# Patient Record
Sex: Female | Born: 1985 | Race: Black or African American | Hispanic: No | Smoking: Never smoker
Health system: Southern US, Community
[De-identification: ages and names within clinical notes are randomized; demographics above are authoritative.]

## PROBLEM LIST (undated history)

## (undated) DIAGNOSIS — D649 Anemia, unspecified: Secondary | ICD-10-CM

---

## 2012-03-15 ENCOUNTER — Encounter (HOSPITAL_COMMUNITY): Payer: Self-pay

## 2012-03-15 ENCOUNTER — Emergency Department (HOSPITAL_COMMUNITY)
Admission: EM | Admit: 2012-03-15 | Discharge: 2012-03-15 | Disposition: A | Discharge status: Home / Self Care | Payer: Self-pay | Attending: Emergency Medicine | Admitting: Emergency Medicine

## 2012-03-15 ENCOUNTER — Emergency Department (HOSPITAL_COMMUNITY): Payer: Self-pay

## 2012-03-15 DIAGNOSIS — Z79899 Other long term (current) drug therapy: Secondary | ICD-10-CM | POA: Insufficient documentation

## 2012-03-15 DIAGNOSIS — J189 Pneumonia, unspecified organism: Secondary | ICD-10-CM | POA: Insufficient documentation

## 2012-03-15 DIAGNOSIS — R509 Fever, unspecified: Secondary | ICD-10-CM | POA: Insufficient documentation

## 2012-03-15 DIAGNOSIS — R0789 Other chest pain: Secondary | ICD-10-CM | POA: Insufficient documentation

## 2012-03-15 MED ORDER — AZITHROMYCIN 250 MG PO TABS
500.0000 mg | ORAL_TABLET | Freq: Once | ORAL | Status: AC
  Administered 2012-03-15: 500 mg via ORAL
  Filled 2012-03-15: qty 2

## 2012-03-15 MED ORDER — HYDROCODONE-ACETAMINOPHEN 5-500 MG PO TABS
1.0000 | ORAL_TABLET | Freq: Four times a day (QID) | ORAL | Status: DC | PRN
Start: 2012-03-15 — End: 2012-08-11

## 2012-03-15 MED ORDER — AZITHROMYCIN 250 MG PO TABS: 250.0000 mg | ORAL_TABLET | Freq: Every day | ORAL | Status: DC

## 2012-03-15 NOTE — ED Notes (Signed)
Chest congestion & pain and cough, yellow sputum since yesterday, chills and fever, denies any n/v/d, Body aches

## 2012-03-15 NOTE — ED Provider Notes (Addendum)
History  This chart was scribed for Gwyneth Sprout, MD by Erskine Emery, ED Scribe. This patient was seen in room TR11C/TR11C and the patient's care was started at 16:59.    CSN: [id number]  Arrival date & time 03/15/12  1631   First MD Initiated Contact with Patient 03/15/12 1659      Chief Complaint  Patient presents with  . Cough    (Consider location/radiation/quality/duration/timing/severity/associated sxs/prior treatment) The history is provided by the patient. No language interpreter was used.  Colleen Fitzgerald is a 26 y.o. female who presents to the Emergency Department complaining of chest congestion and pain, a productive cough with yellow phlegm, nasal congestion, chills, and body aches since yesterday. Pt denies any associated SOB, nausea, emesis, or diarrhea. Pt reports taking alka seltzer, which only mildly relieves her symptoms. Pt knows of no sick contacts. Pt reports she had strep throat 3 weeks ago, then her kids got it. Pt denies any h/o asthma.   History reviewed. No pertinent past medical history.  History reviewed. No pertinent past surgical history.  No family history on file.  History  Substance Use Topics  . Smoking status: Never Smoker   . Smokeless tobacco: Not on file  . Alcohol Use: No    OB History    Grav Para Term Preterm Abortions TAB SAB Ect Mult Living                  Review of Systems  Constitutional: Positive for fever and chills.  HENT: Positive for congestion.   Respiratory: Positive for cough (productive) and chest tightness. Negative for shortness of breath.   Cardiovascular: Positive for chest pain.  Gastrointestinal: Negative for nausea, vomiting and diarrhea.  Genitourinary: Negative for hematuria.  Neurological: Negative for numbness.  Psychiatric/Behavioral: The patient is not nervous/anxious.     Allergies  Review of patient's allergies indicates no known allergies.  Home Medications   Current Outpatient Rx  Name   Route  Sig  Dispense  Refill  . ASPIRIN EFFERVESCENT 325 MG PO TBEF   Oral   Take 325 mg by mouth every 6 (six) hours as needed. For cold symptoms         . TYLENOL COLD MULTI-SYMPTOM DAY PO   Oral   Take 1 capsule by mouth every 6 (six) hours as needed. For cold/flu symptoms         . NORETHIN ACE-ETH ESTRAD-FE 1-20 MG-MCG PO TABS   Oral   Take 1 tablet by mouth daily.           Triage vitals: BP 115/70  Pulse 100  Temp 98.6 F (37 C) (Oral)  Resp 20  Ht 5\' 2"  (1.575 m)  Wt 146 lb (66.225 kg)  BMI 26.70 kg/m2  SpO2 99%  LMP 03/12/2012  Physical Exam  Nursing note and vitals reviewed. Constitutional: She is oriented to person, place, and time. She appears well-developed and well-nourished. No distress.  HENT:  Head: Normocephalic and atraumatic.  Right Ear: External ear normal.  Left Ear: External ear normal.  Mouth/Throat: Oropharynx is clear and moist. No oropharyngeal exudate.       Nasal turbinates are swollen bilaterally.  Eyes: EOM are normal. Pupils are equal, round, and reactive to light.  Neck: Normal range of motion. Neck supple. No tracheal deviation present.  Cardiovascular: Normal rate, regular rhythm and normal heart sounds.   Pulmonary/Chest: Effort normal and breath sounds normal. No respiratory distress. She has no wheezes. She has no rales.  Diffuse chest wall tenderness. No wheezing or rhonchi.  Abdominal: Soft. She exhibits no distension.  Musculoskeletal: Normal range of motion. She exhibits no edema.  Neurological: She is alert and oriented to person, place, and time.  Skin: Skin is warm and dry.  Psychiatric: She has a normal mood and affect.    ED Course  Procedures (including critical care time) DIAGNOSTIC STUDIES: Oxygen Saturation is 99% on room air, nomal by my interpretation.    COORDINATION OF CARE: 17:07--I evaluated the patient and we discussed a treatment plan including a chest x-ray and cough medication to which the pt  agreed. I told the pt that she needs to stay home from work for the next few days and she requested a work note.   Labs Reviewed - No data to display Dg Chest 2 View  03/15/2012  *RADIOLOGY REPORT*  Clinical Data: Cough and SOB  CHEST - 2 VIEW  Comparison: None  Findings: Heart size is normal.  No pleural effusion or edema. Airspace consolidation within the right upper lobe is identified compatible with pneumonia.  Left lung is clear.  Visualized osseous structures are unremarkable.  IMPRESSION:  1.  Right upper lobe pneumonia.   Original Report Authenticated By: Signa Kell, M.D.      No diagnosis found.    MDM   Pt with symptoms consistent with viral URI.  Well appearing here.  No signs of breathing difficulty  No signs of pharyngitis, otitis or abnormal abdominal findings.    CXR with evidence of RUL PNA.  NOrmal O2 sats.  D/ced with abx.      I personally performed the services described in this documentation, which was scribed in my presence.  The recorded information has been reviewed and considered.    Gwyneth Sprout, MD 03/15/12 1723  Gwyneth Sprout, MD 03/15/12 [phone]

## 2012-08-11 ENCOUNTER — Encounter (HOSPITAL_COMMUNITY): Payer: Self-pay | Admitting: Emergency Medicine

## 2012-08-11 ENCOUNTER — Emergency Department (HOSPITAL_COMMUNITY)
Admission: EM | Admit: 2012-08-11 | Discharge: 2012-08-11 | Disposition: A | Discharge status: Home / Self Care | Payer: Self-pay | Attending: Emergency Medicine | Admitting: Emergency Medicine

## 2012-08-11 DIAGNOSIS — R21 Rash and other nonspecific skin eruption: Secondary | ICD-10-CM | POA: Insufficient documentation

## 2012-08-11 DIAGNOSIS — IMO0002 Reserved for concepts with insufficient information to code with codable children: Secondary | ICD-10-CM | POA: Insufficient documentation

## 2012-08-11 DIAGNOSIS — Z79899 Other long term (current) drug therapy: Secondary | ICD-10-CM | POA: Insufficient documentation

## 2012-08-11 DIAGNOSIS — L0390 Cellulitis, unspecified: Secondary | ICD-10-CM

## 2012-08-11 MED ORDER — DOXYCYCLINE HYCLATE 100 MG PO CAPS: 100.0000 mg | ORAL_CAPSULE | Freq: Two times a day (BID) | ORAL | Status: DC

## 2012-08-11 NOTE — ED Provider Notes (Signed)
Medical screening examination/treatment/procedure(s) were performed by non-physician practitioner and as supervising physician I was immediately available for consultation/collaboration.  Lianna Sitzmann R. Avamarie Crossley, MD 08/11/12 1545 

## 2012-08-11 NOTE — ED Provider Notes (Signed)
History     CSN: [id number]  Arrival date & time 08/11/12  1125   First MD Initiated Contact with Patient 08/11/12 1317      Chief Complaint  Patient presents with  . Rash    (Consider location/radiation/quality/duration/timing/severity/associated sxs/prior treatment) HPI Patient presents to the emergency department with rash and redness to her upper front right forearm and bruising, and rash to her right lower leg.  Patient, states she felt like she got bitten by a bug on Saturday night.  She woke up with increased, redness and swelling to her right forearm and bruising to her right lower leg.  Patient was seen in another emergency department and given 5 days for the Keflex twice a day and prednisone along with hydroxyzine.  Patient, states, that the arm seems better, but still red.  Patient denies chest pain, shortness of breath, bleeding, vomiting, nausea, abdominal pain, or fever. History reviewed. No pertinent past medical history.  History reviewed. No pertinent past surgical history.  History reviewed. No pertinent family history.  History  Substance Use Topics  . Smoking status: Never Smoker   . Smokeless tobacco: Not on file  . Alcohol Use: No    OB History   Grav Para Term Preterm Abortions TAB SAB Ect Mult Living                  Review of Systems All other systems negative except as documented in the HPI. All pertinent positives and negatives as reviewed in the HPI. Allergies  Review of patient's allergies indicates no known allergies.  Home Medications   Current Outpatient Rx  Name  Route  Sig  Dispense  Refill  . cephALEXin (KEFLEX) 500 MG capsule   Oral   Take 500 mg by mouth 2 (two) times daily.         . hydrOXYzine (ATARAX/VISTARIL) 25 MG tablet   Oral   Take 25 mg by mouth 4 (four) times daily.         . norethindrone-ethinyl estradiol (JUNEL FE,GILDESS FE,LOESTRIN FE) 1-20 MG-MCG tablet   Oral   Take 1 tablet by mouth at bedtime.            . predniSONE (DELTASONE) 20 MG tablet   Oral   Take 20 mg by mouth 2 (two) times daily.           BP 119/71  Temp(Src) 97.7 F (36.5 C) (Oral)  Resp 18  SpO2 100%  Physical Exam  Constitutional: She is oriented to person, place, and time. She appears well-developed and well-nourished. No distress.  Eyes: Pupils are equal, round, and reactive to light.  Cardiovascular: Normal rate and regular rhythm.   Pulmonary/Chest: Effort normal.  Musculoskeletal:       Arms:      Legs: Neurological: She is alert and oriented to person, place, and time.    ED Course  Procedures (including critical care time)  Patient be treated with antibiotics and told to continue the prednisone.  I advised her, that we'll have her followup with dermatology.  Also, told to return here for 2 days and recheck   MDM          Carlyle Dolly, PA-C 08/11/12 1423

## 2012-08-11 NOTE — ED Notes (Signed)
Pt c/o itching rash to arms seen at another ED on Sunday for same; pt sts some bruising noted to legs with unknown cause

## 2012-12-14 ENCOUNTER — Encounter (HOSPITAL_COMMUNITY): Payer: Self-pay | Admitting: Emergency Medicine

## 2012-12-14 ENCOUNTER — Emergency Department (INDEPENDENT_AMBULATORY_CARE_PROVIDER_SITE_OTHER)
Admission: EM | Admit: 2012-12-14 | Discharge: 2012-12-14 | Disposition: A | Discharge status: Home / Self Care | Payer: Self-pay | Source: Home / Self Care

## 2012-12-14 DIAGNOSIS — M545 Low back pain, unspecified: Secondary | ICD-10-CM

## 2012-12-14 MED ORDER — DICLOFENAC SODIUM 75 MG PO TBEC: 75.0000 mg | DELAYED_RELEASE_TABLET | Freq: Two times a day (BID) | ORAL | Status: DC

## 2012-12-14 MED ORDER — CYCLOBENZAPRINE HCL 10 MG PO TABS: 10.0000 mg | ORAL_TABLET | Freq: Three times a day (TID) | ORAL | Status: DC | PRN

## 2012-12-14 NOTE — ED Notes (Signed)
C/o back pain which started a week ago.  Denies injury.  Pain medications taken but no relief.

## 2012-12-14 NOTE — ED Provider Notes (Signed)
Alannis Hsia is a 27 y.o. female who presents to Urgent Care today for back pain present for one week without injury. Patient notes pain in her bilateral lower back. She denies any pain radiating weakness numbness difficulty walking fevers or chills. She's tried over-the-counter pain medications which we'll been somewhat helpful. She feels well otherwise. The pain is moderate and worse with activity.    PMH reviewed. Healthy History  Substance Use Topics  . Smoking status: Never Smoker   . Smokeless tobacco: Not on file  . Alcohol Use: No   ROS as above Medications reviewed. No current facility-administered medications for this encounter.   Current Outpatient Prescriptions  Medication Sig Dispense Refill  . cyclobenzaprine (FLEXERIL) 10 MG tablet Take 1 tablet (10 mg total) by mouth 3 (three) times daily as needed for muscle spasms.  30 tablet  0  . diclofenac (VOLTAREN) 75 MG EC tablet Take 1 tablet (75 mg total) by mouth 2 (two) times daily.  60 tablet  0  . norethindrone-ethinyl estradiol (JUNEL FE,GILDESS FE,LOESTRIN FE) 1-20 MG-MCG tablet Take 1 tablet by mouth at bedtime.         Exam:  BP 113/75  Pulse 75  Temp(Src) 98.1 F (36.7 C) (Oral)  Resp 15  SpO2 100%  LMP 11/13/2012 Gen: Well NAD Back: Nontender spinal midline. Tender to palpation bilateral paraspinals. Decreased flexion normal extension. Negative straight leg raise test. Strength is intact bilateral lower extremity. Patient can stand on heels and toes and squat and get on and off exam table by herself. Normal gait Tablet refill sensation intact distally bilaterally No results found for this or any previous visit (from the past 24 hour(s)). No results found.  Assessment and Plan: 27 y.o. female with lumbar myofascial pain. Plan to treat with diclofenac and Flexeril. Heating pad and home exercise program. F/u PRN Discussed warning signs or symptoms. Please see discharge instructions. Patient expresses  understanding.      Rodolph Bong, MD 12/14/12 [phone]

## 2012-12-19 ENCOUNTER — Encounter (HOSPITAL_COMMUNITY): Payer: Self-pay | Admitting: Emergency Medicine

## 2012-12-19 ENCOUNTER — Emergency Department (INDEPENDENT_AMBULATORY_CARE_PROVIDER_SITE_OTHER)
Admission: EM | Admit: 2012-12-19 | Discharge: 2012-12-19 | Disposition: A | Discharge status: Home / Self Care | Payer: No Typology Code available for payment source | Source: Home / Self Care

## 2012-12-19 DIAGNOSIS — S39012D Strain of muscle, fascia and tendon of lower back, subsequent encounter: Secondary | ICD-10-CM

## 2012-12-19 DIAGNOSIS — J029 Acute pharyngitis, unspecified: Secondary | ICD-10-CM

## 2012-12-19 DIAGNOSIS — J309 Allergic rhinitis, unspecified: Secondary | ICD-10-CM

## 2012-12-19 DIAGNOSIS — R0982 Postnasal drip: Secondary | ICD-10-CM

## 2012-12-19 MED ORDER — TRAMADOL HCL 50 MG PO TABS: 50.0000 mg | ORAL_TABLET | Freq: Four times a day (QID) | ORAL | Status: DC | PRN

## 2012-12-19 NOTE — ED Provider Notes (Signed)
Medical screening examination/treatment/procedure(s) were performed by resident physician or non-physician practitioner and as supervising physician I was immediately available for consultation/collaboration.   Latrease Kunde DOUGLAS MD.   Quinten Allerton D Shraddha Lebron, MD 12/19/12 1251 

## 2012-12-19 NOTE — ED Provider Notes (Signed)
CSN: [id number]     Arrival date & time 12/19/12  1027 History     First MD Initiated Contact with Patient 12/19/12 1107     Chief Complaint  Patient presents with  . Back Pain  . Sore Throat   (Consider location/radiation/quality/duration/timing/severity/associated sxs/prior Treatment) HPI Comments: 27 year old female presents with sore throat for 2 days and back pain for 2 weeks. She states the sore throat has gradually been getting worse. It is worse with night and associated with coughing. During the day she is not proceed PND that is clearing her throat often. The patient was seen 5 days ago here in the urgent care for her back pain. She was diagnosed with paralumbar muscle pain and prescribed diclofenac and Flexeril. She states that these medicines have not been helping. She states that she has decreased some of her lifting and pulling B. 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 No knee symptoms, no radicular pain or focal weakness or paresthesias.   History reviewed. No pertinent past medical history. History reviewed. No pertinent past surgical history. No family history on file. History  Substance Use Topics  . Smoking status: Never Smoker   . Smokeless tobacco: Not on file  . Alcohol Use: No   OB History   Grav Para Term Preterm Abortions TAB SAB Ect Mult Living                 Review of Systems  Constitutional: Negative for fever and appetite change.  HENT: Positive for congestion, sore throat and rhinorrhea. Negative for hearing loss, trouble swallowing, neck pain, postnasal drip and sinus pressure.   Respiratory: Negative.   Cardiovascular: Negative for chest pain and leg swelling.  Gastrointestinal: Negative.   Genitourinary: Negative.   Musculoskeletal: Positive for back pain.  Skin: Negative for rash.  Neurological: Negative for dizziness, weakness, numbness and headaches.    Allergies  Review of patient's allergies indicates no known allergies.  Home  Medications   Current Outpatient Rx  Name  Route  Sig  Dispense  Refill  . cyclobenzaprine (FLEXERIL) 10 MG tablet   Oral   Take 1 tablet (10 mg total) by mouth 3 (three) times daily as needed for muscle spasms.   30 tablet   0   . diclofenac (VOLTAREN) 75 MG EC tablet   Oral   Take 1 tablet (75 mg total) by mouth 2 (two) times daily.   60 tablet   0   . norethindrone-ethinyl estradiol (JUNEL FE,GILDESS FE,LOESTRIN FE) 1-20 MG-MCG tablet   Oral   Take 1 tablet by mouth at bedtime.          . traMADol (ULTRAM) 50 MG tablet   Oral   Take 1 tablet (50 mg total) by mouth every 6 (six) hours as needed for pain.   15 tablet   0    BP 119/77  Pulse 89  Temp(Src) 98.4 F (36.9 C) (Oral)  Resp 18  SpO2 100%  LMP 11/13/2012 Physical Exam  Nursing note and vitals reviewed. Constitutional: She is oriented to person, place, and time. She appears well-developed and well-nourished. No distress.  Eyes: Conjunctivae and EOM are normal.  Neck: Normal range of motion. Neck supple.  Cardiovascular: Normal rate, regular rhythm and normal heart sounds.   Pulmonary/Chest: Effort normal and breath sounds normal. No respiratory distress. She has no wheezes. She has no rales.  Abdominal: Soft. There is no tenderness.  Musculoskeletal: She exhibits tenderness. She exhibits no edema.  Limited flexion of the lumbar spine to 20. Tenderness over the paralumbar musculature with a (the right. No spinal tenderness or deformity.  Neurological: She is alert and oriented to person, place, and time. No cranial nerve deficit. She exhibits normal muscle tone.  To essentially normal. Use of the legs is unchanged, strong muscle tone. She is able to get onto and off the table without assistance.  Skin: Skin is warm and dry. No rash noted. She is not diaphoretic.  Psychiatric: She has a normal mood and affect.    ED Course   Procedures (including critical care time)  Labs Reviewed  POCT RAPID STREP A  ([facility] ONLY)   No results found. 1. Lumbar strain, subsequent encounter   2. Pharyngitis   3. PND (post-nasal drip)   4. Allergic rhinitis     MDM  No more lifting, bending, pulling. Out of work next 2 days skin restrictions for 7 days. Continue current medications and had Ultram 50 mg by mouth every 6 hours when necessary pain Apply heat over the muscles of pain Perform frequent stretches is demonstrated and instructions As her back is feeling better start the rehabilitation back exercises on the instruction sheet. Patient is reminded to 95% of low musculoskeletal back pain will improve in less than 2 weeks if you apply the correct treatment and avoid exacerbating activities.  Hayden Rasmussen, NP 12/19/12 1200

## 2012-12-19 NOTE — ED Notes (Signed)
Pt c/o persistent back pain onset 2 weeks... Reports she was seen on 8/11 and saw Dr.Corey Given Diclofenac and Flexeril w/no relief... Denies inj/trauma, strenuous activity, urinary sxs, abd pain Also c/o sore throat onset 2 days. sxs include: odynophagia, f/v/n/d Alert w/no signs of acute distress.

## 2012-12-21 LAB — CULTURE, GROUP A STREP

## 2013-01-01 ENCOUNTER — Ambulatory Visit: Payer: Self-pay | Attending: Family Medicine

## 2013-01-03 ENCOUNTER — Encounter (HOSPITAL_COMMUNITY): Payer: Self-pay | Admitting: Physical Medicine and Rehabilitation

## 2013-01-03 ENCOUNTER — Emergency Department (HOSPITAL_COMMUNITY)
Admission: EM | Admit: 2013-01-03 | Discharge: 2013-01-03 | Disposition: A | Discharge status: Home / Self Care | Payer: No Typology Code available for payment source | Attending: Emergency Medicine | Admitting: Emergency Medicine

## 2013-01-03 ENCOUNTER — Emergency Department (HOSPITAL_COMMUNITY): Payer: Self-pay

## 2013-01-03 DIAGNOSIS — Z79899 Other long term (current) drug therapy: Secondary | ICD-10-CM | POA: Insufficient documentation

## 2013-01-03 DIAGNOSIS — Z3202 Encounter for pregnancy test, result negative: Secondary | ICD-10-CM | POA: Insufficient documentation

## 2013-01-03 DIAGNOSIS — M549 Dorsalgia, unspecified: Secondary | ICD-10-CM

## 2013-01-03 DIAGNOSIS — N390 Urinary tract infection, site not specified: Secondary | ICD-10-CM | POA: Insufficient documentation

## 2013-01-03 LAB — URINE MICROSCOPIC-ADD ON

## 2013-01-03 LAB — URINALYSIS, ROUTINE W REFLEX MICROSCOPIC
Nitrite: NEGATIVE
Specific Gravity, Urine: 1.008 (ref 1.005–1.030)
Urobilinogen, UA: 0.2 mg/dL (ref 0.0–1.0)

## 2013-01-03 LAB — POCT PREGNANCY, URINE: Preg Test, Ur: NEGATIVE

## 2013-01-03 MED ORDER — CEPHALEXIN 500 MG PO CAPS: 500.0000 mg | ORAL_CAPSULE | Freq: Three times a day (TID) | ORAL | Status: DC

## 2013-01-03 MED ORDER — DEXAMETHASONE SODIUM PHOSPHATE 10 MG/ML IJ SOLN
10.0000 mg | Freq: Once | INTRAMUSCULAR | Status: AC
  Administered 2013-01-03: 10 mg via INTRAMUSCULAR
  Filled 2013-01-03: qty 1

## 2013-01-03 MED ORDER — HYDROCODONE-ACETAMINOPHEN 5-325 MG PO TABS: 1.0000 | ORAL_TABLET | Freq: Four times a day (QID) | ORAL | Status: DC | PRN

## 2013-01-03 MED ORDER — MELOXICAM 15 MG PO TABS: 15.0000 mg | ORAL_TABLET | Freq: Every day | ORAL | Status: DC

## 2013-01-03 MED ORDER — IBUPROFEN 800 MG PO TABS
800.0000 mg | ORAL_TABLET | Freq: Once | ORAL | Status: AC
  Administered 2013-01-03: 800 mg via ORAL
  Filled 2013-01-03: qty 1

## 2013-01-03 NOTE — ED Notes (Signed)
Pt attempted to give [place] a urine sample but was unable right now

## 2013-01-03 NOTE — ED Provider Notes (Signed)
CSN: [id number]     Arrival date & time 01/03/13  4098 History   First MD Initiated Contact with Patient 01/03/13 0932     Chief Complaint  Patient presents with  . Abdominal Pain   (Consider location/radiation/quality/duration/timing/severity/associated sxs/prior Treatment) HPI  27 year old female presents emergency by with chief complaint of back pain.  The patient has been seen twice previously at [facility] urgent care facility.  She states that the pain has been going on for 3 weeks.  It is worsened by standing, walking and moving.  Lying down relieved her pain.  The patient has taken Flexeril, tramadol, and full apparent without relief of her symptoms.  The patient denies any radiation down the legs.  Patient states that the pain is constant however at times it has felt as if it is getting better.  Yesterday she did house work around her house and today she is in severe pain.  She states her husband had to help her out of bed and get her into warm bath before she was able to break the spasm in her back and get up to walk. Denies weakness, loss of bowel/bladder function or saddle anesthesia. Denies neck stiffness, headache, rash.  Denies fever or recent procedures to back. The patient denies urinary or vaginal symptoms.  She denies flank pain, colicky pain no history of kidney stones.    No past medical history on file. No past surgical history on file. No family history on file. History  Substance Use Topics  . Smoking status: Never Smoker   . Smokeless tobacco: Not on file  . Alcohol Use: No   OB History   Grav Para Term Preterm Abortions TAB SAB Ect Mult Living                 Review of Systems  Constitutional: Negative for fever, chills and fatigue.  HENT: Negative for neck pain and neck stiffness.   Eyes: Negative for visual disturbance.  Respiratory: Negative for chest tightness and shortness of breath.   Cardiovascular: Negative for chest pain.  Gastrointestinal:  Negative for nausea, vomiting, abdominal pain and constipation.  Genitourinary: Negative for dysuria, frequency, hematuria, flank pain, vaginal discharge and pelvic pain.  Musculoskeletal: Positive for back pain and gait problem. Negative for myalgias, joint swelling and arthralgias.  Neurological: Negative for weakness, numbness and headaches.  Psychiatric/Behavioral: Negative for behavioral problems and confusion. The patient is not nervous/anxious.     Allergies  Review of patient's allergies indicates no known allergies.  Home Medications   Current Outpatient Rx  Name  Route  Sig  Dispense  Refill  . cyclobenzaprine (FLEXERIL) 10 MG tablet   Oral   Take 1 tablet (10 mg total) by mouth 3 (three) times daily as needed for muscle spasms.   30 tablet   0   . diclofenac (VOLTAREN) 75 MG EC tablet   Oral   Take 1 tablet (75 mg total) by mouth 2 (two) times daily.   60 tablet   0   . norethindrone-ethinyl estradiol (JUNEL FE,GILDESS FE,LOESTRIN FE) 1-20 MG-MCG tablet   Oral   Take 1 tablet by mouth at bedtime.          . traMADol (ULTRAM) 50 MG tablet   Oral   Take 1 tablet (50 mg total) by mouth every 6 (six) hours as needed for pain.   15 tablet   0    BP 123/66  Pulse 96  Temp(Src) 98.2 F (36.8 C) (Oral)  Resp 18  SpO2 99%  LMP 11/13/2012 Physical Exam  Constitutional: She is oriented to person, place, and time. She appears well-developed and well-nourished. No distress.  HENT:  Head: Normocephalic and atraumatic.  Eyes: Conjunctivae are normal. No scleral icterus.  Neck: Normal range of motion.  Cardiovascular: Normal rate, regular rhythm and normal heart sounds.  Exam reveals no gallop and no friction rub.   No murmur heard. Pulmonary/Chest: Effort normal and breath sounds normal. No respiratory distress.  Abdominal: Soft. Bowel sounds are normal. She exhibits no distension and no mass. There is no tenderness. There is no guarding.  Musculoskeletal:   Patient is exquisitely tender to palpation over the right SI joint.  She has moderate tenderness to palpation over the left SI joint.  Chest palpable spasm in the bilateral lumbar paraspinal muscles.  No CVA tenderness.  Range of motion is limited due to pain.  DTR is normal.  Gait is antalgic.  She is neurovascularly intact.  Neurological: She is alert and oriented to person, place, and time.  Skin: Skin is warm and dry. She is not diaphoretic.    ED Course  Procedures (including critical care time) Labs Review Labs Reviewed  URINALYSIS, ROUTINE W REFLEX MICROSCOPIC - Abnormal; Notable for the following:    APPearance CLOUDY (*)    Hgb urine dipstick TRACE (*)    Leukocytes, UA LARGE (*)    All other components within normal limits  URINE MICROSCOPIC-ADD ON - Abnormal; Notable for the following:    Squamous Epithelial / LPF FEW (*)    Bacteria, UA FEW (*)    All other components within normal limits  POCT PREGNANCY, URINE   Imaging Review Dg Lumbar Spine Complete  01/03/2013   *RADIOLOGY REPORT*  Clinical Data: Back pain  LUMBAR SPINE - COMPLETE 4+ VIEW  Comparison: None.  Findings: Normal alignment without fracture.  Preserved vertebral body heights and disc spaces.  No definite pars defects.  Normal pedicles. Slight sclerosis of the right SI joint laterally. Nonobstructive bowel gas pattern.  No acute osseous finding.  IMPRESSION: No acute finding.   Original Report Authenticated By: Judie Petit. Miles Costain, M.D.    MDM   1. Back pain   2. UTI (lower urinary tract infection)    Patient with low back pain ongoing for 3 weeks.  Her symptoms appear consistent with sacroiliitis.  There applied symptoms.  In getting imaging of the lumbar spine.  Urinalysis to rule out infection.  Patient given 800 mg ibuprofen by mouth for pain while here.  12:12 PM BP 123/66  Pulse 96  Temp(Src) 98.2 F (36.8 C) (Oral)  Resp 18  SpO2 99%  LMP 12/23/2012 Patient results show sclerosis of the right SI joint.   This is consistent with her physical exam.  Personally reviewed the images using our PACs system.  The patient also appears to have a urinary tract infection today.  I'm going to change the patient's medication regimen to Mobic one daily, Norco for when her pain is too severe, and Keflex for her urinary tract infection.  The patient will follow up with community health and the vomitus.  I asked her to restrict activities at work and she should not lift anything over 10 pounds.  Ice instead of heat.  Arthor Captain, PA-C 01/03/13 1214

## 2013-01-03 NOTE — ED Notes (Signed)
PT ambulated with baseline gait; VSS; A&Ox3; no signs of distress; respirations even and unlabored; skin warm and dry; no questions upon discharge.  

## 2013-01-03 NOTE — ED Notes (Signed)
Pt presents to department for evaluation of R sided flank pain radiating to lower abdomen. Ongoing x3 weeks. Was seen for same and prescribed muscle relaxer, no relief of pain with medication. 10/10 pain upon arrival. Denies urinary symptoms. No nausea/vomiting. Pt is alert and oriented x4.

## 2013-01-03 NOTE — ED Provider Notes (Signed)
Medical screening examination/treatment/procedure(s) were performed by non-physician practitioner and as supervising physician I was immediately available for consultation/collaboration.  Courtney F Horton, MD 01/03/13 1929 

## 2013-01-03 NOTE — ED Notes (Signed)
Patient transported to X-ray 

## 2013-01-04 LAB — URINE CULTURE

## 2013-01-20 ENCOUNTER — Ambulatory Visit: Payer: No Typology Code available for payment source

## 2013-02-08 ENCOUNTER — Encounter: Payer: Self-pay | Admitting: Internal Medicine

## 2013-02-08 ENCOUNTER — Ambulatory Visit: Payer: No Typology Code available for payment source | Attending: Internal Medicine | Admitting: Internal Medicine

## 2013-02-08 VITALS — BP 106/70 | HR 87 | Temp 99.2°F | Resp 18 | Wt 158.0 lb

## 2013-02-08 DIAGNOSIS — M62830 Muscle spasm of back: Secondary | ICD-10-CM | POA: Insufficient documentation

## 2013-02-08 DIAGNOSIS — M545 Low back pain, unspecified: Secondary | ICD-10-CM | POA: Insufficient documentation

## 2013-02-08 DIAGNOSIS — M5380 Other specified dorsopathies, site unspecified: Secondary | ICD-10-CM

## 2013-02-08 DIAGNOSIS — R6889 Other general symptoms and signs: Secondary | ICD-10-CM | POA: Insufficient documentation

## 2013-02-08 NOTE — Progress Notes (Signed)
Pt is here to est care C/o lower back pain Has been to [facility] ER and Urgent Care for same sxs Alert w/no signs of acute distress.

## 2013-02-08 NOTE — Progress Notes (Signed)
Patient ID: Colleen Fitzgerald, female   DOB: 08-07-1985, 27 y.o.   MRN: [id number]  Patient Demographics  Colleen Fitzgerald, is a 27 y.o. female  CSN: [id number]  MRN: [id number]  DOB - 1986/04/28  Outpatient Primary MD for the patient is No PCP Per Patient   With History of -  History reviewed. No pertinent past medical history.    History reviewed. No pertinent past surgical history.  in for   Chief Complaint  Patient presents with  . Establish Care     HPI  Colleen Fitzgerald  is a 27 y.o. female, no medical problems except lower back pain which happened a few weeks ago, she also feels cold more than other people. No other subjective complaints. Her back pain actually has now resolved, she comes to the clinic to establish care. She denies any headache. No chest pain shortness of breath, no abdominal pain or discomfort no bowel bladder incontinence. No weakness in any extremity.    Review of Systems    In addition to the HPI above,  No Fever-chills, No Headache, No changes with Vision or hearing, No problems swallowing food or Liquids, No Chest pain, Cough or Shortness of Breath, No Abdominal pain, No Nausea or Vommitting, Bowel movements are regular, No Blood in stool or Urine, No dysuria, No new skin rashes or bruises, No new joints pains-aches,  No new weakness, tingling, numbness in any extremity, No recent weight gain or loss, No polyuria, polydypsia or polyphagia, No significant Mental Stressors.  A full 10 point Review of Systems was done, except as stated above, all other Review of Systems were negative.   Social History History  Substance Use Topics  . Smoking status: Never Smoker   . Smokeless tobacco: Not on file  . Alcohol Use: No     Family History No history of CAD at young age  Prior to Admission medications   Medication Sig Start Date End Date Taking? Authorizing Provider  meloxicam (MOBIC) 15 MG tablet Take 1 tablet (15 mg total) by mouth  daily. Take 1 daily with food. 01/03/13  Yes Arthor Captain, PA-C  norethindrone-ethinyl estradiol (JUNEL FE,GILDESS FE,LOESTRIN FE) 1-20 MG-MCG tablet Take 1 tablet by mouth at bedtime.    Yes Historical Provider, MD  cephALEXin (KEFLEX) 500 MG capsule Take 1 capsule (500 mg total) by mouth 3 (three) times daily. 01/03/13   Arthor Captain, PA-C  HYDROcodone-acetaminophen (NORCO) 5-325 MG per tablet Take 1-2 tablets by mouth every 6 (six) hours as needed for pain. 01/03/13   Arthor Captain, PA-C    No Known Allergies  Physical Exam  Vitals  Blood pressure 106/70, pulse 87, temperature 99.2 F (37.3 C), temperature source Oral, resp. rate 18, weight 158 lb (71.668 kg), SpO2 100.00%.   1. General Young African American female sitting on clinic examination table in no apparent distress,     2. Normal affect and insight, Not Suicidal or Homicidal, Awake Alert, Oriented X 3.  3. No F.N deficits, ALL C.Nerves Intact, Strength 5/5 all 4 extremities, Sensation intact all 4 extremities, Plantars down going.  4. Ears and Eyes appear Normal, Conjunctivae clear, PERRLA. Moist Oral Mucosa.  5. Supple Neck, No JVD, No cervical lymphadenopathy appriciated, No Carotid Bruits.  6. Symmetrical Chest wall movement, Good air movement bilaterally, CTAB.  7. RRR, No Gallops, Rubs or Murmurs, No Parasternal Heave.  8. Positive Bowel Sounds, Abdomen Soft, Non tender, No organomegaly appriciated,No rebound -guarding or rigidity.  9.  No Cyanosis, Normal Skin Turgor,  No Skin Rash or Bruise.  10. Good muscle tone,  joints appear normal , no effusions, Normal ROM. No spine tenderness on exam, good range of motion, straight leg raise test negative bilaterally.  11. No Palpable Lymph Nodes in Neck or Axillae     Data Review  No results found for this basename: WBC, HGB, HCT, MCV, PLT      Chemistry   No results found for this basename: NA, K, CL, CO2, BUN, CREATININE, GLU   No results found for this  basename: CALCIUM, ALKPHOS, AST, ALT, BILITOT       No results found for this basename: HGBA1C    No results found for this basename: CHOL, HDL, LDLCALC, LDLDIRECT, TRIG, CHOLHDL    No results found for this basename: TSH    No results found for this basename: PSA         Assessment and plan  Lower back pain. 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 Pain has now resolved. L-spine x-rays done in the ER reviewed and they were unremarkable. She has no pain or tenderness on exam, good range of motion, straight leg raise test is negative bilaterally. She was advised to wear lumbar support belt at work. Over-the-counter ibuprofen and heating pads if pain reoccurs.   Feeling cold all the time. We'll check TSH   Routine health maintenance.  Patient is up-to-date on tetanus shot. She refuses flu shot.  Last Pap smear was less than a month ago and it was unremarkable per patient    Leroy Sea M.D on 02/08/2013 at 4:30 PM

## 2013-02-09 LAB — TSH: TSH: 2.355 u[IU]/mL (ref 0.350–4.500)

## 2013-02-15 ENCOUNTER — Telehealth: Payer: Self-pay

## 2013-02-15 NOTE — Telephone Encounter (Signed)
Patient left message on the phone to call her Back-called back and got her machine Left message to return our call

## 2013-05-04 ENCOUNTER — Encounter: Payer: Self-pay | Admitting: Internal Medicine

## 2013-05-04 ENCOUNTER — Ambulatory Visit: Payer: No Typology Code available for payment source | Attending: Internal Medicine | Admitting: Internal Medicine

## 2013-05-04 VITALS — BP 114/80 | HR 85 | Temp 98.2°F | Resp 16 | Ht 62.0 in | Wt 160.0 lb

## 2013-05-04 DIAGNOSIS — R5381 Other malaise: Secondary | ICD-10-CM | POA: Insufficient documentation

## 2013-05-04 LAB — COMPLETE METABOLIC PANEL WITH GFR
AST: 13 U/L (ref 0–37)
Albumin: 3.8 g/dL (ref 3.5–5.2)
Alkaline Phosphatase: 60 U/L (ref 39–117)
Calcium: 8.9 mg/dL (ref 8.4–10.5)
Chloride: 105 mEq/L (ref 96–112)
Potassium: 3.7 mEq/L (ref 3.5–5.3)
Sodium: 137 mEq/L (ref 135–145)
Total Protein: 7 g/dL (ref 6.0–8.3)

## 2013-05-04 LAB — CBC WITH DIFFERENTIAL/PLATELET
Basophils Absolute: 0 10*3/uL (ref 0.0–0.1)
Eosinophils Relative: 2 % (ref 0–5)
HCT: 38 % (ref 36.0–46.0)
Lymphocytes Relative: 23 % (ref 12–46)
Lymphs Abs: 1.8 10*3/uL (ref 0.7–4.0)
MCV: 90.3 fL (ref 78.0–100.0)
Monocytes Absolute: 0.7 10*3/uL (ref 0.1–1.0)
Neutro Abs: 5 10*3/uL (ref 1.7–7.7)
Platelets: 267 10*3/uL (ref 150–400)
RBC: 4.21 MIL/uL (ref 3.87–5.11)
RDW: 14.2 % (ref 11.5–15.5)
WBC: 7.6 10*3/uL (ref 4.0–10.5)

## 2013-05-04 NOTE — Progress Notes (Signed)
Patient ID: Colleen Fitzgerald, female   DOB: [birth year], 27 y.o.   MRN: [id number]   CC:  HPI: 27 year old female here for evaluation of fatigue. She denies any back pain. She states that she has no sexual drive. She wanted to change her birth control She has been obtaining birth control from Dr. Mayford Knife gynecologist in [facility] The patient denies any chest pain shortness of breath any other cardiopulmonary symptoms    No Known Allergies History reviewed. No pertinent past medical history. Current Outpatient Prescriptions on File Prior to Visit  Medication Sig Dispense Refill  . norethindrone-ethinyl estradiol (JUNEL FE,GILDESS FE,LOESTRIN FE) 1-20 MG-MCG tablet Take 1 tablet by mouth at bedtime.       . cephALEXin (KEFLEX) 500 MG capsule Take 1 capsule (500 mg total) by mouth 3 (three) times daily.  30 capsule  0  . HYDROcodone-acetaminophen (NORCO) 5-325 MG per tablet Take 1-2 tablets by mouth every 6 (six) hours as needed for pain.  20 tablet  0  . meloxicam (MOBIC) 15 MG tablet Take 1 tablet (15 mg total) by mouth daily. Take 1 daily with food.  10 tablet  0   No current facility-administered medications on file prior to visit.   Family History  Problem Relation Age of Onset  . Hypertension Mother   . Diabetes Father    History   Social History  . Marital Status: Single    Spouse Name: N/A    Number of Children: N/A  . Years of Education: N/A   Occupational History  . Not on file.   Social History Main Topics  . Smoking status: Never Smoker   . Smokeless tobacco: Not on file  . Alcohol Use: No  . Drug Use: No  . Sexual Activity: Yes    Birth Control/ Protection: None, Pill   Other Topics Concern  . Not on file   Social History Narrative  . No narrative on file    Review of Systems  Constitutional: Negative for fever, chills, diaphoresis, activity change, appetite change and fatigue.  HENT: Negative for ear pain, nosebleeds, congestion, facial swelling,  rhinorrhea, neck pain, neck stiffness and ear discharge.   Eyes: Negative for pain, discharge, redness, itching and visual disturbance.  Respiratory: Negative for cough, choking, chest tightness, shortness of breath, wheezing and stridor.   Cardiovascular: Negative for chest pain, palpitations and leg swelling.  Gastrointestinal: Negative for abdominal distention.  Genitourinary: Negative for dysuria, urgency, frequency, hematuria, flank pain, decreased urine volume, difficulty urinating and dyspareunia.  Musculoskeletal: Negative for back pain, joint swelling, arthralgias and gait problem.  Neurological: Negative for dizziness, tremors, seizures, syncope, facial asymmetry, speech difficulty, weakness, light-headedness, numbness and headaches.  Hematological: Negative for adenopathy. Does not bruise/bleed easily.  Psychiatric/Behavioral: Negative for hallucinations, behavioral problems, confusion, dysphoric mood, decreased concentration and agitation.    Objective:  There were no vitals filed for this visit.  Physical Exam  Constitutional: Appears well-developed and well-nourished. No distress.  HENT: Normocephalic. External right and left ear normal. Oropharynx is clear and moist.  Eyes: Conjunctivae and EOM are normal. PERRLA, no scleral icterus.  Neck: Normal ROM. Neck supple. No JVD. No tracheal deviation. No thyromegaly.  CVS: RRR, S1/S2 +, no murmurs, no gallops, no carotid bruit.  Pulmonary: Effort and breath sounds normal, no stridor, rhonchi, wheezes, rales.  Abdominal: Soft. BS +,  no distension, tenderness, rebound or guarding.  Musculoskeletal: Normal range of motion. No edema and no tenderness.  Lymphadenopathy: No lymphadenopathy noted, cervical, inguinal. Neuro:  Alert. Normal reflexes, muscle tone coordination. No cranial nerve deficit. Skin: Skin is warm and dry. No rash noted. Not diaphoretic. No erythema. No pallor.  Psychiatric: Normal mood and affect. Behavior,  judgment, thought content normal.   No results found for this basename: WBC, HGB, HCT, MCV, PLT   No results found for this basename: CREATININE, BUN, NA, K, CL, CO2    No results found for this basename: HGBA1C   Lipid Panel  No results found for this basename: chol, trig, hdl, cholhdl, vldl, ldlcalc       Assessment and plan:   Patient Active Problem List   Diagnosis Date Noted  . Back muscle spasm 02/08/2013  . Cold feeling 02/08/2013        Fatigue Normal TSH on her last visit We'll check a CBC ESR, HIV antibody, UDS, CK, If all this is negative the patient needs to be seen by a psychiatrist his be screened for depression  Birth Control the patient will call her gynecologist to change her medication for birth control   The patient was given clear instructions to go to ER or return to medical center if symptoms don't improve, worsen or new problems develop. The patient verbalized understanding. The patient was told to call to get any lab results if not heard anything in the next week.

## 2013-05-04 NOTE — Progress Notes (Signed)
Pt is here today requesting to get on a new birth control. Pt states that she has been feeling tired w/ no desire to have sex.

## 2013-05-05 ENCOUNTER — Telehealth: Payer: Self-pay | Admitting: *Deleted

## 2013-05-05 LAB — CK TOTAL AND CKMB (NOT AT ARMC)

## 2013-05-05 NOTE — Telephone Encounter (Signed)
Message copied by Terrian Sentell, [place] R on Wed May 05, 2013 12:29 PM ------      Message from: Susie Cassette MD, [facility]      Created: Wed May 05, 2013  9:44 AM       Please notify patient that all labs are normal ------

## 2013-05-08 LAB — DRUG SCREEN, URINE, NO CONFIRMATION
Amphetamine Screen, Ur: NEGATIVE
Barbiturate Quant, Ur: NEGATIVE
Benzodiazepines.: NEGATIVE
Cocaine Metabolites: NEGATIVE
Creatinine,U: 55.3 mg/dL
Marijuana Metabolite: NEGATIVE
Methadone: NEGATIVE
Opiate Screen, Urine: NEGATIVE
Phencyclidine (PCP): NEGATIVE
Propoxyphene: NEGATIVE

## 2013-05-17 ENCOUNTER — Telehealth: Payer: Self-pay | Admitting: *Deleted

## 2013-05-17 NOTE — Telephone Encounter (Signed)
Message copied by Carlicia Leavens, [place] R on Mon May 17, 2013  4:46 PM ------      Message from: Susie CassetteABROL MD, Germain OsgoodNAYANA      Created: Mon May 17, 2013  1:55 PM       Please notify patient of the labs are normal ------

## 2013-06-06 ENCOUNTER — Emergency Department (HOSPITAL_COMMUNITY)
Admission: EM | Admit: 2013-06-06 | Discharge: 2013-06-06 | Disposition: A | Discharge status: Home / Self Care | Payer: Self-pay | Attending: Emergency Medicine | Admitting: Emergency Medicine

## 2013-06-06 ENCOUNTER — Encounter (HOSPITAL_COMMUNITY): Payer: Self-pay | Admitting: Emergency Medicine

## 2013-06-06 DIAGNOSIS — M5431 Sciatica, right side: Secondary | ICD-10-CM

## 2013-06-06 DIAGNOSIS — M5430 Sciatica, unspecified side: Secondary | ICD-10-CM | POA: Insufficient documentation

## 2013-06-06 MED ORDER — HYDROCODONE-ACETAMINOPHEN 5-325 MG PO TABS: 1.0000 | ORAL_TABLET | ORAL | Status: DC | PRN

## 2013-06-06 MED ORDER — HYDROCODONE-ACETAMINOPHEN 5-325 MG PO TABS: 2.0000 | ORAL_TABLET | ORAL | Status: DC | PRN

## 2013-06-06 MED ORDER — PREDNISONE 20 MG PO TABS: 40.0000 mg | ORAL_TABLET | Freq: Every day | ORAL | Status: DC

## 2013-06-06 NOTE — Discharge Instructions (Signed)
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 Return if Symptoms worsen.   Take medication as prescribed.  Take Ibuprofen as directed during the day for pain, take with meals.  Emergency Department Resource Guide 1) Find a Doctor and Pay Out of Pocket Although you won't have to find out who is covered by your insurance plan, it is a good idea to ask around and get recommendations. You will then need to call the office and see if the doctor you have chosen will accept you as a new patient and what types of options they offer for patients who are self-pay. Some doctors offer discounts or will set up payment plans for their patients who do not have insurance, but you will need to ask so you aren't surprised when you get to your appointment.  2) Contact Your Local Health Department Not all health departments have doctors that can see patients for sick visits, but many do, so it is worth a call to see if yours does. If you don't know where your local health department is, you can check in your phone book. The CDC also has a tool to help you locate your state's health department, and many state websites also have listings of all of their local health departments.  3) Find a Walk-in Clinic If your illness is not likely to be very severe or complicated, you may want to try a walk in clinic. These are popping up all over the country in pharmacies, drugstores, and shopping centers. They're usually staffed by nurse practitioners or physician assistants that have been trained to treat common illnesses and complaints. They're usually fairly quick and inexpensive. However, if you have serious medical issues or chronic medical problems, these are probably not your best option.  No Primary Care Doctor: - Call Health Connect at  [phone] - they can help you locate a primary care doctor that  accepts your insurance, provides certain  services, etc. - Physician Referral Service- [phone]  Chronic Pain Problems: Organization         Address  Phone   Notes  [facility]  [phone]) [phone] Patients need to be referred by their primary care doctor.   Medication Assistance: Organization         Address  Phone   Notes  [facility] Medication [facility]istance Program [address] [place], [place] [address] [phone]) [phone] --Must be a resident of [place] -- Must have NO insurance coverage whatsoever (no Medicaid/ Medicare, etc.) -- The pt. MUST have a primary care doctor that directs their care regularly and follows them in the community   MedAssist  [phone]   [employer]  [phone]) [phone]    Agencies that provide inexpensive medical care: Organization         Address  Phone   Notes  [facility]  [phone]   [facility]    [phone]   [facility] [address] [place], [place] [address] [phone]) [phone]   [facility] [address], [place] [phone]) [phone]   Planned Parenthood    [phone]   [facility]    [phone]   [facility] and [facility]  [address], [place] Phone:  [phone], Fax:  [phone]) [phone] Hours of Operation:  9 am - 6 pm, M-F.  Also accepts Medicaid/Medicare and self-pay.  [facility] for [address] [place], [address], [place] Phone: [phone], Fax: [phone]. Hours of Operation:  8:30 am - 5:30 pm, M-F.  Also accepts Medicaid and self-pay.  [facility] [address] [place] Phone: [phone]   [place], [place], [place] [phone], Ext. [phone] Mondays & Thursdays: 7-9 AM.  First 15 patients are seen on a first come, first serve basis.    [place] Providers:  Organization         Address  Phone   Notes  [facility] [address], [place] [phone] Also accepts self-pay patients.  [facility] [address], [place]  [phone]   [place], [address], [place] [phone]   [facility] [address], [place] [phone]   Lucianne Lei [address], [place]   [phone] Only accepts [place] Access [place] patients after they have their name applied to their card.   Self-Pay (no insurance) in [facility] Xxx:  Organization         Address  Phone   Notes  Sickle Cell Patients, [facility] [place] [phone]   [facility] [place] [phone]   [facility] [place]  [place], [place], [place] [phone]   [facility]/Dr. Osei-Bonsu  [address] [place] or [address], [place] [phone] Phone number for both [place] and [place] locations is the same.  Urgent Medical and [facility] [address], [place] [phone]   [facility] [address] [place] or [address] [phone]   [facility] [address] [place] [phone], phone; [phone], fax Sees patients 1st and 3rd Saturday of every month.  Must not qualify for public or private insurance (i.e. Medicaid, Medicare, [place] Health Choice, Veterans' Benefits)  Household income should be no more than 200% of the poverty level The clinic cannot treat you if you are pregnant or think you are pregnant  Sexually transmitted diseases are not treated at the clinic.    Dental Care: Organization         Address  Phone  Notes  [facility] Department of [facility] [place] [phone] Accepts  children up to age 57 who are enrolled in [place] or [place]; pregnant women with a Medicaid card; and children who have applied for Medicaid or [place] Health Choice, but were declined, whose parents can pay a reduced fee at time of service.  [facility] Department of [facility]  [address], [place] [phone] Accepts children up to age 80 who are enrolled in [place] or [place]; pregnant women with a Medicaid card; and children who have applied for Medicaid or [place] Health Choice, but were declined, whose parents can pay a reduced fee at time of service.  [facility] Access PROGRAM  [place] [phone] Patients are seen by appointment only. Walk-ins are not accepted. [place] will see patients 42 years of age and older. Monday - Tuesday (  8am-5pm) Most Wednesdays (8:30-5pm) $30 per visit, cash only  [facility] Adult Dental Access PROGRAM  [address], [facility] [phone] Patients are seen by appointment only. Walk-ins are not accepted. [place] will see patients 78 years of age and older. One Wednesday Evening (Monthly: Volunteer Based).  $30 per visit, cash only  [place]  [phone] for adults; Children under age 55, call Graduate Pediatric Dentistry at [phone]. Children aged 17-14, please call [phone] to request a pediatric application.  Dental services are provided in all areas of dental care including fillings, crowns and bridges, complete and partial dentures, implants, gum treatment, root canals, and extractions. Preventive care is also provided. Treatment is provided to both adults and children. Patients are selected via a lottery and there is often a waiting list.   [facility] [address], [place]  [phone] [email or web address]   [facility] [address] [place], [place] [phone], Ext. [phone] Second and  Fourth Thursday of each month, opens at 6:30 AM; Clinic ends at 9 AM.  Patients are seen on a first-come first-served basis, and a limited number are seen during each clinic.   [facility]  [address] Hillard Danker [place], [place] [phone]   Eligibility Requirements You must have lived in [place], [place], or [place] counties for at least the last three months.   You cannot be eligible for state or federal sponsored [employer], including [employer], [place], or [employer].   You generally cannot be eligible for healthcare insurance through your employer.    How to apply: Eligibility screenings are held every Tuesday and Wednesday afternoon from 1:00 pm until 4:00 pm. You do not need an appointment for the interview!  [facility] [address] [place], [place]   Head of the [place]  [place] Department  [place]  [phone]    Behavioral Health Resources in the Community: Intensive Outpatient Programs Organization         Address  Phone  Notes  [place]. [address] [place], [place] [phone]   [facility] [address], [place], [place]   ADS: Alcohol & Drug Svcs [address] [place], [place]   [place] [address],  [place], [place] or [phone]   Substance Abuse Resources Organization         Address  Phone  Notes  Alcohol and Drug Services  [phone]   [place]  [phone]   [facility] [place]   Chinita Pester  [phone]   Residential & Outpatient Substance Abuse Program  [phone]   Psychological Services Organization         Address  Phone  Notes  [facility] [place]  [place]  [phone]   [place]  [address], [place] or [phone]    Mobile Crisis Teams Organization         Address  Phone  Notes  [facility]atives, Mobile Crisis Care Unit  [phone]   Assertive Psychotherapeutic Services  [address] [place], [place]   [place] Levels [address], [place] [phone]    Self-Help/Support Groups Organization         Address  Phone  Notes  Mental Health Assoc. of [place] - variety of support groups  [place] Call for more information  Narcotics Anonymous (NA), [facility] [address], [employer] [place]  2 meetings at this location   Special educational needs teacher         Address  Phone  Notes  ASAP Residential Treatment [place],    [place]  [phone]   [facility]  [address] [place] [phone], [place], [place]   [place], [place] [phone] Admissions: 8am-3pm M-F  Incentives Substance [place] [address]    [place], [place] [phone]   [facility] [address] [place], [place], [place]   [facility] [address],  [place], [place]   [facility]ight Programs - Intensive Outpatient [address], [place], [place]   [facility] [place].,  [place], [place] [phone] or [phone]   [facility] Services (RTS) [address] [place], [place] Accepts Medicaid  [facility] [employer] [address],  [place] [phone] Substance Abuse/Addiction Treatment   [facility] Organization         Address  Phone  Notes  [facility]  [phone]   Domenic Schwab, PhD [address] Arlis Porta [place], [place]   [phone] or [phone]   [place], [place] [phone]   [facility] [address] [place], [place] [phone] Insurance/Medicaid/sponsorship through [facility] and Families [address], [address] [place]                                    [place], [place] [phone] [place] [address], [place] [phone]    Dr. Adele Schilder  [phone]   [facility] Dept. 1) [address] [place] 2) [place] 3)  [place] 65, [place] [phone]  [phone]   [place] [phone] or [phone] (After Hours)

## 2013-06-06 NOTE — ED Provider Notes (Signed)
CSN: [id number]     Arrival date & time 06/06/13  0715 History   First MD Initiated Contact with Patient 06/06/13 [phone]     Chief Complaint  Patient presents with  . Back Pain   (Consider location/radiation/quality/duration/timing/severity/associated sxs/prior Treatment) HPI Comments: Colleen Fitzgerald is a 28 year-old female, presenting the Emergency Department with a chief complaint of ongoing lower back pain for several months.  She reports radiating right lower extremity pain to the posterior leg.  She reports an increase in pain with movement and pressure on her low back.  She denies bowel or bladder symptoms.   She reports taking ibuprofen, aleve, and goody's powder without relief.    Patient is a 28 y.o. female presenting with back pain. The history is provided by the patient and medical records. No language interpreter was used.  Back Pain   History reviewed. No pertinent past medical history. History reviewed. No pertinent past surgical history. Family History  Problem Relation Age of Onset  . Hypertension Mother   . Diabetes Father    History  Substance Use Topics  . Smoking status: Never Smoker   . Smokeless tobacco: Not on file  . Alcohol Use: No   OB History   Grav Para Term Preterm Abortions TAB SAB Ect Mult Living                 Review of Systems  Musculoskeletal: Positive for back pain.    Allergies  Review of patient's allergies indicates no known allergies.  Home Medications   Current Outpatient Rx  Name  Route  Sig  Dispense  Refill  . ibuprofen (ADVIL,MOTRIN) 200 MG tablet   Oral   Take 400 mg by mouth every 6 (six) hours as needed for mild pain.         Marland Kitchen norethindrone-ethinyl estradiol (JUNEL FE,GILDESS FE,LOESTRIN FE) 1-20 MG-MCG tablet   Oral   Take 1 tablet by mouth at bedtime.           BP 123/69  Pulse 87  Temp(Src) 98.4 F (36.9 C) (Oral)  Resp 16  SpO2 100%  LMP 06/01/2013 Physical Exam  Nursing note and vitals  reviewed. Constitutional: She is oriented to person, place, and time. She appears well-developed and well-nourished. No distress.  HENT:  Head: Atraumatic.  Eyes: EOM are normal. Pupils are equal, round, and reactive to light. No scleral icterus.  Neck: Neck supple.  Cardiovascular: Normal rate and regular rhythm.   Pulmonary/Chest: Effort normal. No respiratory distress. She has no wheezes. She has no rales.  Abdominal: Soft. She exhibits no distension. There is no tenderness. There is no rebound.  Musculoskeletal: Normal range of motion.       Back:  Palpation and pressure to the Left SI joint recreates discomfort. Is able to ambulate with a limp.  Neurological: She is alert and oriented to person, place, and time. No sensory deficit. She exhibits normal muscle tone.  Reflex Scores:      Patellar reflexes are 2+ on the right side and 2+ on the left side. Skin: Skin is warm and dry. No rash noted. She is not diaphoretic.  Psychiatric: She has a normal mood and affect. Her behavior is normal.    ED Course  Procedures (including critical care time) Labs Review Labs Reviewed - No data to display Imaging Review No results found.  EKG Interpretation   None       MDM   1. Right sciatic nerve pain  Pt with ongoing lower back pain for over 6 months.  Reports an increase in pain with radiation to right lower extremity.  SI joint severe-moderately tender on palpation.  NV intact.  No concern for Cauda equina, or abscess at this time.  EMR reveals XR performed on 01/03/2013 that shows slight sclerosis of the right SI joint.  No history of trauma or injury and I don't believe imaging is neccessary at this time. Discussed imaging results from 01/03/2013, and treatment plan with the patient, which includes prednisone, and pain management. Return precautions given. Reports understanding and no other concerns at this time.  Patient is stable for discharge at this time.  EMR  reveals: 01/03/2013 *RADIOLOGY REPORT* Clinical Data: Back pain LUMBAR SPINE - COMPLETE 4+ VIEW Comparison: None. Findings: Normal alignment without fracture. Preserved vertebral body heights and disc spaces. No definite pars defects. Normal pedicles. Slight sclerosis of the right SI joint laterally. Nonobstructive bowel gas pattern. No acute osseous finding. IMPRESSION: No acute finding. Original Report Authenticated By: Judie PetitM. Miles CostainShick, M.D.   Meds given in ED:  Prednisone with taper Norco   Clabe SealLauren M Darline Faith, PA-C 06/09/13 1304

## 2013-06-06 NOTE — ED Notes (Signed)
Pt from home reports low back pain x5 months. [facility] and was told she had scoliosis. Pt states she is still having pain. Pt denies urinary problems. Pt is A&O and in NAD

## 2013-06-07 ENCOUNTER — Encounter (HOSPITAL_COMMUNITY): Payer: Self-pay | Admitting: Emergency Medicine

## 2013-06-07 ENCOUNTER — Emergency Department (HOSPITAL_COMMUNITY)
Admission: EM | Admit: 2013-06-07 | Discharge: 2013-06-07 | Disposition: A | Discharge status: Home / Self Care | Payer: Self-pay | Attending: Emergency Medicine | Admitting: Emergency Medicine

## 2013-06-07 DIAGNOSIS — IMO0002 Reserved for concepts with insufficient information to code with codable children: Secondary | ICD-10-CM | POA: Insufficient documentation

## 2013-06-07 DIAGNOSIS — R51 Headache: Secondary | ICD-10-CM | POA: Insufficient documentation

## 2013-06-07 DIAGNOSIS — R112 Nausea with vomiting, unspecified: Secondary | ICD-10-CM | POA: Insufficient documentation

## 2013-06-07 DIAGNOSIS — R519 Headache, unspecified: Secondary | ICD-10-CM

## 2013-06-07 DIAGNOSIS — Z3202 Encounter for pregnancy test, result negative: Secondary | ICD-10-CM | POA: Insufficient documentation

## 2013-06-07 LAB — URINALYSIS, ROUTINE W REFLEX MICROSCOPIC
BILIRUBIN URINE: NEGATIVE
Glucose, UA: NEGATIVE mg/dL
HGB URINE DIPSTICK: NEGATIVE
KETONES UR: NEGATIVE mg/dL
Leukocytes, UA: NEGATIVE
NITRITE: NEGATIVE
Protein, ur: NEGATIVE mg/dL
Specific Gravity, Urine: 1.03 (ref 1.005–1.030)
UROBILINOGEN UA: 0.2 mg/dL (ref 0.0–1.0)
pH: 6 (ref 5.0–8.0)

## 2013-06-07 LAB — CBC WITH DIFFERENTIAL/PLATELET
BASOS PCT: 0 % (ref 0–1)
Basophils Absolute: 0 10*3/uL (ref 0.0–0.1)
EOS ABS: 0 10*3/uL (ref 0.0–0.7)
EOS PCT: 0 % (ref 0–5)
HCT: 40.5 % (ref 36.0–46.0)
Hemoglobin: 13.1 g/dL (ref 12.0–15.0)
Lymphocytes Relative: 13 % (ref 12–46)
Lymphs Abs: 1.3 10*3/uL (ref 0.7–4.0)
MCH: 29.6 pg (ref 26.0–34.0)
MCHC: 32.3 g/dL (ref 30.0–36.0)
MCV: 91.4 fL (ref 78.0–100.0)
MONOS PCT: 4 % (ref 3–12)
Monocytes Absolute: 0.4 10*3/uL (ref 0.1–1.0)
NEUTROS PCT: 83 % — AB (ref 43–77)
Neutro Abs: 8.2 10*3/uL — ABNORMAL HIGH (ref 1.7–7.7)
PLATELETS: 274 10*3/uL (ref 150–400)
RBC: 4.43 MIL/uL (ref 3.87–5.11)
RDW: 14.4 % (ref 11.5–15.5)
WBC: 9.8 10*3/uL (ref 4.0–10.5)

## 2013-06-07 LAB — COMPREHENSIVE METABOLIC PANEL
ALBUMIN: 3.6 g/dL (ref 3.5–5.2)
ALK PHOS: 68 U/L (ref 39–117)
ALT: 17 U/L (ref 0–35)
AST: 18 U/L (ref 0–37)
BUN: 11 mg/dL (ref 6–23)
CALCIUM: 9.1 mg/dL (ref 8.4–10.5)
CO2: 20 mEq/L (ref 19–32)
Chloride: 104 mEq/L (ref 96–112)
Creatinine, Ser: 0.77 mg/dL (ref 0.50–1.10)
GFR calc non Af Amer: >90 mL/min (ref >90)
GLUCOSE: 120 mg/dL — AB (ref 70–99)
POTASSIUM: 4.1 meq/L (ref 3.7–5.3)
SODIUM: 140 meq/L (ref 137–147)
TOTAL PROTEIN: 7.8 g/dL (ref 6.0–8.3)
Total Bilirubin: 0.2 mg/dL — ABNORMAL LOW (ref 0.3–1.2)

## 2013-06-07 LAB — POCT PREGNANCY, URINE: PREG TEST UR: NEGATIVE

## 2013-06-07 LAB — LIPASE, BLOOD: LIPASE: 31 U/L (ref 11–59)

## 2013-06-07 MED ORDER — ONDANSETRON 8 MG PO TBDP
8.0000 mg | ORAL_TABLET | Freq: Once | ORAL | Status: AC
  Administered 2013-06-07: 8 mg via ORAL
  Filled 2013-06-07: qty 1

## 2013-06-07 MED ORDER — ONDANSETRON HCL 4 MG/2ML IJ SOLN
4.0000 mg | INTRAMUSCULAR | Status: AC
  Administered 2013-06-07: 4 mg via INTRAVENOUS
  Filled 2013-06-07: qty 2

## 2013-06-07 MED ORDER — ACETAMINOPHEN 500 MG PO TABS
1000.0000 mg | ORAL_TABLET | Freq: Once | ORAL | Status: AC
  Administered 2013-06-07: 1000 mg via ORAL
  Filled 2013-06-07: qty 2

## 2013-06-07 MED ORDER — SODIUM CHLORIDE 0.9 % IV BOLUS (SEPSIS)
1000.0000 mL | Freq: Once | INTRAVENOUS | Status: AC
  Administered 2013-06-07: 1000 mL via INTRAVENOUS

## 2013-06-07 MED ORDER — PROMETHAZINE HCL 25 MG PO TABS: 25.0000 mg | ORAL_TABLET | Freq: Four times a day (QID) | ORAL | Status: DC | PRN

## 2013-06-07 MED ORDER — PROMETHAZINE HCL 25 MG RE SUPP: 25.0000 mg | Freq: Four times a day (QID) | RECTAL | Status: DC | PRN

## 2013-06-07 NOTE — ED Provider Notes (Signed)
CSN: [id number]     Arrival date & time 06/07/13  0436 History   First MD Initiated Contact with Patient 06/07/13 [phone]     Chief Complaint  Patient presents with  . Emesis   (Consider location/radiation/quality/duration/timing/severity/associated sxs/prior Treatment) Patient is a 28 y.o. female presenting with vomiting.  Emesis  28 yo female presents with new onset N/V that started last night around 9:30 pm. Patient admits to multiple episodes "20". Patient denies any abdominal pain, diarrhea, or constipation. Last BM was this morning and noted to be normal. Patient denies any fever/chills, sick contacts, CP, SOB, bloody stools, or urinary symptoms. Admits to 2/10 HA currently. Patient states she was seen yesterday in the ED for back pain and dx with RIGHT Sciatic nerve pain.  Patient denies any PMH or surgical hx. Does not smoker or drink.  History reviewed. No pertinent past medical history. History reviewed. No pertinent past surgical history. Family History  Problem Relation Age of Onset  . Hypertension Mother   . Diabetes Father    History  Substance Use Topics  . Smoking status: Never Smoker   . Smokeless tobacco: Not on file  . Alcohol Use: No   OB History   Grav Para Term Preterm Abortions TAB SAB Ect Mult Living                 Review of Systems  Gastrointestinal: Positive for vomiting.  All other systems reviewed and are negative.    Allergies  Review of patient's allergies indicates no known allergies.  Home Medications   Current Outpatient Rx  Name  Route  Sig  Dispense  Refill  . HYDROcodone-acetaminophen (NORCO/VICODIN) 5-325 MG per tablet   Oral   Take 1 tablet by mouth every 4 (four) hours as needed.   10 tablet   0   . ibuprofen (ADVIL,MOTRIN) 200 MG tablet   Oral   Take 400 mg by mouth every 6 (six) hours as needed for mild pain.         Marland Kitchen. norethindrone-ethinyl estradiol (JUNEL FE,GILDESS FE,LOESTRIN FE) 1-20 MG-MCG tablet   Oral   Take 1  tablet by mouth at bedtime.          . predniSONE (DELTASONE) 20 MG tablet   Oral   Take 2 tablets (40 mg total) by mouth daily. Take 40 mg by mouth daily for 3 days, then 20mg  by mouth daily for 3 days, then 10mg  daily for 3 days. Take with meals   12 tablet   0   . promethazine (PHENERGAN) 25 MG suppository   Rectal   Place 1 suppository (25 mg total) rectally every 6 (six) hours as needed for nausea or vomiting.   12 each   0   . promethazine (PHENERGAN) 25 MG tablet   Oral   Take 1 tablet (25 mg total) by mouth every 6 (six) hours as needed for nausea or vomiting.   12 tablet   0    BP 111/66  Pulse 80  Temp(Src) 98 F (36.7 C) (Oral)  Resp 16  Ht 5\' 2"  (1.575 m)  Wt 160 lb (72.576 kg)  BMI 29.26 kg/m2  SpO2 100%  LMP 06/01/2013 Physical Exam  Nursing note and vitals reviewed. Constitutional: She is oriented to person, place, and time. She appears well-developed and well-nourished. No distress.  HENT:  Head: Normocephalic and atraumatic.  Right Ear: Tympanic membrane and ear canal normal.  Left Ear: Tympanic membrane and ear canal normal.  Nose: Mucosal  edema present.  Mouth/Throat: Uvula is midline, oropharynx is clear and moist and mucous membranes are normal.  Eyes: Conjunctivae and EOM are normal. Pupils are equal, round, and reactive to light.  Neck: Normal range of motion. Neck supple.  Cardiovascular: Normal rate and regular rhythm.  Exam reveals no gallop and no friction rub.   No murmur heard. Pulmonary/Chest: Effort normal and breath sounds normal. No respiratory distress. She has no wheezes. She has no rales.  Abdominal: Soft. Normal appearance and bowel sounds are normal. There is no hepatosplenomegaly. There is no tenderness. There is no rigidity, no rebound, no guarding, no tenderness at McBurney's point and negative Murphy's sign.  Musculoskeletal: Normal range of motion. She exhibits no edema.  Lymphadenopathy:    She has no cervical adenopathy.   Neurological: She is alert and oriented to person, place, and time. She has normal strength. No cranial nerve deficit or sensory deficit.  Skin: Skin is warm and dry. She is not diaphoretic.  Psychiatric: She has a normal mood and affect. Her behavior is normal.    ED Course  Procedures (including critical care time) Labs Review Labs Reviewed  CBC WITH DIFFERENTIAL - Abnormal; Notable for the following:    Neutrophils Relative % 83 (*)    Neutro Abs 8.2 (*)    All other components within normal limits  COMPREHENSIVE METABOLIC PANEL - Abnormal; Notable for the following:    Glucose, Bld 120 (*)    Total Bilirubin 0.2 (*)    All other components within normal limits  URINALYSIS, ROUTINE W REFLEX MICROSCOPIC  LIPASE, BLOOD  POCT PREGNANCY, URINE   Imaging Review No results found.  EKG Interpretation   None       MDM   1. N&V (nausea and vomiting)   2. HA (headache)    Patient afebrile with Normal VS.  Lipase negative.  UA negative.  Urine preg negative  N/V resolved with tx in ED.   Plan to treat patient's symptoms and have patient follow up with PCP in 2 days for reevaluation. Return precautions given. Discussed all results with patient. Patient agrees with plan. Discharged in good condition.   Meds given in ED:  Medications  ondansetron (ZOFRAN-ODT) disintegrating tablet 8 mg (8 mg Oral Given 06/07/13 0506)  sodium chloride 0.9 % bolus 1,000 mL (0 mLs Intravenous Stopped 06/07/13 0740)  acetaminophen (TYLENOL) tablet 1,000 mg (1,000 mg Oral Given 06/07/13 0753)  ondansetron (ZOFRAN) injection 4 mg (4 mg Intravenous Given 06/07/13 0751)    Discharge Medication List as of 06/07/2013  8:27 AM    START taking these medications   Details  promethazine (PHENERGAN) 25 MG suppository Place 1 suppository (25 mg total) rectally every 6 (six) hours as needed for nausea or vomiting., Starting 06/07/2013, Until Discontinued, Print    promethazine (PHENERGAN) 25 MG tablet Take 1  tablet (25 mg total) by mouth every 6 (six) hours as needed for nausea or vomiting., Starting 06/07/2013, Until Discontinued, Print         Allen Norris [place], PA-C 06/08/13 [phone]

## 2013-06-07 NOTE — ED Notes (Signed)
Pt reports emesis since 2130 last night. Denies diarrhea.

## 2013-06-07 NOTE — ED Notes (Signed)
paatient denies nausea or abdominal pain.

## 2013-06-07 NOTE — Discharge Instructions (Signed)
Drink plenty of fluids. Slowly progress your diet from liquids to solids as tolerated. Follow up with a primary care doctor in 2 days for reevalaution. Resource guide attached below for follow up referral. Return to ED should your symptoms worse, you develop fever/chills, or intractable nausea/vomiting.    Emergency Department Resource Guide 1) Find a Doctor and Pay Out of Pocket Although you won't have to find out who is covered by your insurance plan, it is a good idea to ask around and get recommendations. You will then need to call the office and see if the doctor you have chosen will accept you as a new patient and what types of options they offer for patients who are self-pay. Some doctors offer discounts or will set up payment plans for their patients who do not have insurance, but you will need to ask so you aren't surprised when you get to your appointment.  2) Contact Your Local Health Department Not all health departments have doctors that can see patients for sick visits, but many do, so it is worth a call to see if yours does. If you don't know where your local health department is, you can check in your phone book. The CDC also has a tool to help you locate your state's health department, and many state websites also have listings of all of their local health departments.  3) Find a Walk-in Clinic If your illness is not likely to be very severe or complicated, you may want to try a walk in clinic. These are popping up all over the country in pharmacies, drugstores, and shopping centers. They're usually staffed by nurse practitioners or physician assistants that have been trained to treat common illnesses and complaints. They're usually fairly quick and inexpensive. However, if you have serious medical issues or chronic medical problems, these are probably not your best option.  No Primary Care Doctor: - Call Health Connect at  [phone] - they can help you locate a primary care doctor that   accepts your insurance, provides certain services, etc. - Physician Referral Service- [phone]  Chronic Pain Problems: Organization         Address  Phone   Notes  [facility]  [phone] Patients need to be referred by their primary care doctor.   Medication Assistance: Organization         Address  Phone   Notes  [facility] [address] [place], [place] [address] [phone] --Must be a resident of [facility] -- Must have NO insurance coverage whatsoever (no Medicaid/ Medicare, etc.) -- The pt. MUST have a primary care doctor that directs their care regularly and follows them in the community   [facility]  [phone]   [employer]  [phone]    Agencies that provide inexpensive medical care: Organization         Address  Phone   Notes  [facility]  [phone]   [facility]    [phone]   [facility] [address] [place], [place] [address] [phone]   [facility] [address], [place] [phone]   [facility]    [phone]   [facility]    [phone]   [facility]  [address], [place] Phone:  [phone], Fax:  [phone] Hours of Operation:  9 am -  6 pm, M-F.  Also accepts Medicaid/Medicare and self-pay.  °[facility] ° [address], [place] Phone: [phone], Fax: [phone]. Hours of Operation:  8:30 am - 5:30 pm, M-F.  Also accepts Medicaid and self-pay.  °[facility] [place] [address], [place] Phone: [phone]   °[facility] [address], [place], [place] [phone], Ext. 123 Mondays & Thursdays: 7-9 AM.  First 15 patients are seen on a first come, first serve basis. °  ° °Medicaid-accepting [place] Providers: ° °Organization          Address  Phone   Notes  °[facility] [address], [place] [phone] Also accepts self-pay patients.  °[facility] [address], [place] ° ([phone]) [phone]   °[facility] [address], [place] ([phone]) [phone]   °[facility] [address] [place] [address], [place] ([phone]) [phone]   °Veita Bland [address], [place]  ° ([phone]) [phone] Only accepts [place] Access Medicaid patients after they have their name applied to their card.  ° °Self-Pay (no insurance) in [place]: ° °Organization         Address  Phone   Notes  °Sickle Cell Patients, [facility] [address], [place] ([phone]) [phone]   °[facility] [address], [place] ([phone]) [phone]   °[facility] ° [address], [place] ([phone]) [phone]   °[facility]/Dr. Osei-Bonsu ° [address] [place] [address], [place] or [address], [place] [phone]) [phone] Phone number for both [place] and [place] locations is the same.  °[facility] [address], [place] ([phone]) [phone]   °[facility] [address] [place] [address], [place] or [address] (336) [phone] °(336) [phone]   °[facility] [address], [place] (336) [phone], phone; [phone], fax Sees patients 1st and 3rd Saturday of every month.  Must not qualify for public or private insurance (i.e. Medicaid, Medicare, [place] Health Choice, Veterans' Benefits) • Household income should be no more than 200% of the poverty level •The clinic cannot treat you if you are pregnant or think you are pregnant • Sexually transmitted diseases are not treated at the clinic.  ° ° °Dental Care: °Organization         Address  Phone  Notes  °[facility] [address],  [place] [phone]) [phone] Accepts children up to age 21 who are enrolled in Medicaid or [place] Health Choice; pregnant women with a Medicaid card; and children who have applied for Medicaid or [place] Health Choice, but were declined, whose parents can pay a reduced fee at time of service.  °[facility] [place]  [address], [place] (336) [phone] Accepts children up to age 21 who are enrolled in Medicaid or [place] Health Choice; pregnant women with a Medicaid card; and children who have applied for Medicaid or [place] Health Choice, but were declined, whose parents can pay a reduced fee at time of service.  °[facility] ° [address],  ([phone]) [phone] Patients are seen by appointment only. Walk-ins are not accepted. [facility] will see patients 18 years of age and   older. °Monday - Tuesday (8am-5pm) °Most Wednesdays (8:30-5pm) °$30 per visit, cash only  °[facility] ° [address], [place] ([phone]) [phone] Patients are seen by appointment only. Walk-ins are not accepted. [facility] will see patients 18 years of age and older. °One Wednesday Evening (Monthly: Volunteer Based).  $30 per visit, cash only  °[facility]  ([phone]) [phone] for adults; Children under age 4, call Graduate Pediatric Dentistry at [phone]. Children aged 4-14, please call [phone] to request a pediatric application. ° Dental services are provided in all areas of dental care including fillings, crowns and bridges, complete and partial dentures, implants, gum treatment, root canals, and extractions. Preventive care is also provided. Treatment is provided to both adults and children. °Patients are selected via a lottery and there is often a waiting list. °  °[facility] [address], °[place] ° ([phone]) [phone] [email or web address] °  °[facility] [address], [place], [place]  (336)[phone], Ext. 123 Second and Fourth Thursday of each month, opens at 6:30 AM; Clinic ends at 9 AM.  Patients are seen on a first-come first-served basis, and a limited number are seen during each clinic.  ° °[facility] ° [address], [place], [place] [phone]) [phone]   Eligibility Requirements °You must have lived in [place], [place], or [place] counties for at least the last three months. °  You cannot be eligible for state or federal sponsored healthcare insurance, including Veterans Administration, Medicaid, or Medicare. °  You generally cannot be eligible for healthcare insurance through your employer.  °  How to apply: °Eligibility screenings are held every Tuesday and Wednesday afternoon from 1:00 pm until 4:00 pm. You do not need an appointment for the interview!  °[facility] [address], [place], [place] [phone]   °[facility]  336-342-8273   °[facility]  336-703-3100   °[facility]  336-570-6415   ° °Behavioral Health Resources in the Community: °Intensive Outpatient Programs °Organization         Address  Phone  Notes  °[facility] [address], [place], [place] 336-878-6098   °[facility] [address], [place], [place] 336-832-9800   °[facility] [address], [place], [place] ° 336-882-2125   °[facility] [address],  °[place], [place] 1-800-853-5163 or 336-641-4981   °Substance Abuse Resources °Organization         Address  Phone  Notes  °[facility]  336-882-2125   °[facility]  336-784-9470   °[facility]  336-285-9073   °[facility]  336-845-3988   °Residential & Outpatient Substance Abuse Program  1-800-659-3381   °Psychological Services °Organization         Address  Phone  Notes  °[facility]  336- 832-9600   °[facility]  336- 378-7881    °[facility] [address], [place] 1-800-853-5163 or 336-641-4981   ° °Mobile Crisis Teams °Organization         Address  Phone  Notes  °[facility], Mobile Crisis Care Unit  1-877-626-1772   °Assertive °[facility] ° [address] [place], [place] 336-834-9664   °Sharon DeEsch [address] °[place] 336-554-5454   ° °Self-Help/Support Groups °Organization         Address    Phone             Notes  °[facility] [place] - variety of support groups  336- 373-1402 Call for more information  °Narcotics Anonymous (NA), [facility] [address], °[place]  2 meetings at this location  ° °Residential Treatment Programs °Organization         Address  Phone  Notes  °[facility] [address],    °[place]  1-866-801-8205   °[facility] ° [address], [place], [place] 704-293-8524   °[facility] [address], [place] 336-845-3988 Admissions: 8am-3pm M-F  °[facility] [address]    °[place], [place] 336-841-1104   °[facility] [address], [place], [place] 336-379-7146   °[facility] [address]  °[place], [place] 336-285-9073   °[facility] - Intensive Outpatient [address], [place], [place] 336-852-3033   °[facility] [address]  °[place], [place] 1-877-615-2722 or 336-784-9470   °[facility] ([facility] [address] [place], [place] 336-227-7417 Accepts Medicaid  °[facility] [address]  °[place] 1-800-659-3381 Substance Abuse/Addiction Treatment  ° °[place] Behavioral Health Resources °Organization         Address  Phone  Notes  °[facility]  (888) 581-9988   °Julie Brannon, PhD [address] [place], [place]   (336) 349-5553 or (336) 951-0000   °[facility]   [address] °[place], [place] (336) 349-4454   °[facility] [address], [place], [place] (336) 342-8316 Insurance/Medicaid/sponsorship through [facility]  °[facility] [address]                                    [place], [place] (336) 342-8316 Therapy/tele-psych/case  °[facility] [address]  ° [place], [place] (336) 349-2233    °Dr. Arfeen  (336) 349-4544   °[facility] [place]  [employer] [place] [facility] 1) [address],  °2) [address], [place] °3)  [address], [place] (336) 349-3220 °(336) 342-7768 ° °(336) 342-8140   °[place] Child Abuse Hotline (336) 342-1394 or (336) 342-3537 (After Hours)    ° ° ° °

## 2013-06-08 NOTE — ED Provider Notes (Signed)
Medical screening examination/treatment/procedure(s) were performed by non-physician practitioner and as supervising physician I was immediately available for consultation/collaboration.  EKG Interpretation   None        Jamis Kryder, MD 06/08/13 0822 

## 2013-06-14 NOTE — ED Provider Notes (Signed)
Medical screening examination/treatment/procedure(s) were performed by non-physician practitioner and as supervising physician I was immediately available for consultation/collaboration.  EKG Interpretation   None         Abdirizak Richison Y. Kemon Devincenzi, MD 06/14/13 1406 

## 2013-06-29 ENCOUNTER — Ambulatory Visit: Payer: Self-pay

## 2013-10-03 ENCOUNTER — Encounter (HOSPITAL_COMMUNITY): Payer: Self-pay | Admitting: Emergency Medicine

## 2013-10-03 ENCOUNTER — Emergency Department (HOSPITAL_COMMUNITY)
Admission: EM | Admit: 2013-10-03 | Discharge: 2013-10-03 | Disposition: A | Discharge status: Home / Self Care | Payer: Self-pay | Attending: Emergency Medicine | Admitting: Emergency Medicine

## 2013-10-03 DIAGNOSIS — Z3202 Encounter for pregnancy test, result negative: Secondary | ICD-10-CM | POA: Insufficient documentation

## 2013-10-03 DIAGNOSIS — N760 Acute vaginitis: Secondary | ICD-10-CM | POA: Insufficient documentation

## 2013-10-03 LAB — I-STAT CHEM 8, ED
BUN: 10 mg/dL (ref 6–23)
CALCIUM ION: 1.2 mmol/L (ref 1.12–1.23)
Chloride: 103 mEq/L (ref 96–112)
Creatinine, Ser: 0.9 mg/dL (ref 0.50–1.10)
Glucose, Bld: 96 mg/dL (ref 70–99)
HCT: 40 % (ref 36.0–46.0)
HEMOGLOBIN: 13.6 g/dL (ref 12.0–15.0)
Potassium: 3.7 mEq/L (ref 3.7–5.3)
SODIUM: 141 meq/L (ref 137–147)
TCO2: 23 mmol/L (ref 0–100)

## 2013-10-03 LAB — URINALYSIS, ROUTINE W REFLEX MICROSCOPIC
BILIRUBIN URINE: NEGATIVE
GLUCOSE, UA: NEGATIVE mg/dL
HGB URINE DIPSTICK: NEGATIVE
KETONES UR: NEGATIVE mg/dL
Leukocytes, UA: NEGATIVE
Nitrite: NEGATIVE
PH: 6 (ref 5.0–8.0)
Protein, ur: NEGATIVE mg/dL
SPECIFIC GRAVITY, URINE: 1.014 (ref 1.005–1.030)
Urobilinogen, UA: 0.2 mg/dL (ref 0.0–1.0)

## 2013-10-03 LAB — WET PREP, GENITAL
CLUE CELLS WET PREP: NONE SEEN
TRICH WET PREP: NONE SEEN
Yeast Wet Prep HPF POC: NONE SEEN

## 2013-10-03 LAB — POC URINE PREG, ED: Preg Test, Ur: NEGATIVE

## 2013-10-03 MED ORDER — METRONIDAZOLE 500 MG PO TABS
2000.0000 mg | ORAL_TABLET | Freq: Once | ORAL | Status: AC
  Administered 2013-10-03: 2000 mg via ORAL
  Filled 2013-10-03: qty 4

## 2013-10-03 MED ORDER — CEFTRIAXONE SODIUM 250 MG IJ SOLR
250.0000 mg | Freq: Once | INTRAMUSCULAR | Status: AC
  Administered 2013-10-03: 250 mg via INTRAMUSCULAR
  Filled 2013-10-03: qty 250

## 2013-10-03 MED ORDER — LIDOCAINE HCL 1 % IJ SOLN
INTRAMUSCULAR | Status: AC
  Administered 2013-10-03: 1.2 mL
  Filled 2013-10-03: qty 20

## 2013-10-03 MED ORDER — AZITHROMYCIN 250 MG PO TABS
1000.0000 mg | ORAL_TABLET | Freq: Once | ORAL | Status: AC
  Administered 2013-10-03: 1000 mg via ORAL
  Filled 2013-10-03: qty 4

## 2013-10-03 MED ORDER — ONDANSETRON 4 MG PO TBDP
4.0000 mg | ORAL_TABLET | Freq: Once | ORAL | Status: AC
  Administered 2013-10-03: 4 mg via ORAL
  Filled 2013-10-03: qty 1

## 2013-10-03 MED ORDER — FENTANYL CITRATE 0.05 MG/ML IJ SOLN
50.0000 ug | Freq: Once | INTRAMUSCULAR | Status: DC
Start: 2013-10-03 — End: 2013-10-03

## 2013-10-03 NOTE — ED Provider Notes (Signed)
Medical screening examination/treatment/procedure(s) were conducted as a shared visit with non-physician practitioner(s) and myself.  I personally evaluated the patient during the encounter.   EKG Interpretation None      Pt c/o suprapubic pain. No fever or chills. No nv. abd soft nt. Labs/ua.   Suzi Roots, MD 10/03/13 [phone]

## 2013-10-03 NOTE — ED Provider Notes (Signed)
CSN: [id number]     Arrival date & time 10/03/13  1709 History   First MD Initiated Contact with Patient 10/03/13 1911     Chief Complaint  Patient presents with  . Abdominal Pain     (Consider location/radiation/quality/duration/timing/severity/associated sxs/prior Treatment) HPI  She presents to the emergency department for evaluation of suprapubic cramping for 4 days. She denies having a vaginal discharge, bleeding, dyspareunia. She denies having any dysuria, polyuria, hesitancy or abnormal looking urine. She's not had any fevers, nausea, vomiting, diarrhea. She describes it as mild and intermittent. She denies history of frequent infections. She reports being in a monogamous relationship and does not use condoms with her husband. Does not take birth control.    History reviewed. No pertinent past medical history. History reviewed. No pertinent past surgical history. Family History  Problem Relation Age of Onset  . Hypertension Mother   . Diabetes Father    History  Substance Use Topics  . Smoking status: Never Smoker   . Smokeless tobacco: Not on file  . Alcohol Use: No   OB History   Grav Para Term Preterm Abortions TAB SAB Ect Mult Living                 Review of Systems   Review of Systems  Gen: no weight loss, fevers, chills, night sweats  Eyes: no discharge or drainage, no occular pain or visual changes  Nose: no epistaxis or rhinorrhea  Mouth: no dental pain, no sore throat  Neck: no neck pain  Lungs:No wheezing, coughing or hemoptysis CV: no chest pain, palpitations, dependent edema or orthopnea  Abd: no abdominal pain, nausea, vomiting, diarrhea GU: no dysuria or gross hematuria + pelvic cramping MSK:  No muscle weakness or pain Neuro: no headache, no focal neurologic deficits  Skin: no rash or wounds Psyche: no complaints    Allergies  Review of patient's allergies indicates no known allergies.  Home Medications   Prior to Admission medications    Medication Sig Start Date End Date Taking? Authorizing Provider  acetaminophen (TYLENOL) 325 MG tablet Take 650 mg by mouth every 6 (six) hours as needed (pain).   Yes Historical Provider, MD  miconazole (MONISTAT 1 COMBINATION PACK) kit Place 1 each vaginally once.   Yes Historical Provider, MD   BP 117/76  Pulse 80  Temp(Src) 98.2 F (36.8 C) (Oral)  Resp 18  SpO2 99%  LMP 09/12/2013 Physical Exam  Nursing note and vitals reviewed. Constitutional: She appears well-developed and well-nourished. No distress.  HENT:  Head: Normocephalic and atraumatic.  Eyes: Pupils are equal, round, and reactive to light.  Neck: Normal range of motion. Neck supple.  Cardiovascular: Normal rate and regular rhythm.   Pulmonary/Chest: Effort normal.  Abdominal: Soft.  Genitourinary: Uterus normal. Cervix exhibits no motion tenderness and no friability. There is tenderness (central) around the vagina. No bleeding around the vagina. No foreign body around the vagina. Vaginal discharge found.  Neurological: She is alert.  Skin: Skin is warm and dry.    ED Course  Procedures (including critical care time) Labs Review Labs Reviewed  WET PREP, GENITAL - Abnormal; Notable for the following:    WBC, Wet Prep HPF POC MANY (*)    All other components within normal limits  URINE CULTURE  GC/CHLAMYDIA PROBE AMP  URINALYSIS, ROUTINE W REFLEX MICROSCOPIC  POC URINE PREG, ED  I-STAT CHEM 8, ED    Imaging Review No results found.   EKG Interpretation None  MDM   Final diagnoses:  Vaginosis    Patient has returned with Many WBC's. She is married. Will treat her since she is symptomatic and wait for cultures to result for diagnosis. She declined pain medication while she was in the ED as she said she is having mild cramping and does not need it. Her vital signs are stable and she does not have any symptoms or findings consistent with an acute process going on. [facility]  28  y.o.Charlotta Rayner's evaluation in the Emergency Department is complete. It has been determined that no acute conditions requiring further emergency intervention are present at this time. The patient/guardian have been advised of the diagnosis and plan. We have discussed signs and symptoms that warrant return to the ED, such as changes or worsening in symptoms.  Vital signs are stable at discharge. Filed Vitals:   10/03/13 1735  BP: 117/76  Pulse: 80  Temp: 98.2 F (36.8 C)  Resp: 18    Patient/guardian has voiced understanding and agreed to follow-up with the PCP or specialist.     Linus Mako, PA-C 10/03/13 2058

## 2013-10-03 NOTE — Discharge Instructions (Signed)
Vaginitis Vaginitis is an inflammation of the vagina. It is most often caused by a change in the normal balance of the bacteria and yeast that live in the vagina. This change in balance causes an overgrowth of certain bacteria or yeast, which causes the inflammation. There are different types of vaginitis, but the most common types are:  Bacterial vaginosis.  Yeast infection (candidiasis).  Trichomoniasis vaginitis. This is a sexually transmitted infection (STI).  Viral vaginitis.  Atropic vaginitis.  Allergic vaginitis. CAUSES  The cause depends on the type of vaginitis. Vaginitis can be caused by:  Bacteria (bacterial vaginosis).  Yeast (yeast infection).  A parasite (trichomoniasis vaginitis)  A virus (viral vaginitis).  Low hormone levels (atrophic vaginitis). Low hormone levels can occur during pregnancy, breastfeeding, or after menopause.  Irritants, such as bubble baths, scented tampons, and feminine sprays (allergic vaginitis). Other factors can change the normal balance of the yeast and bacteria that live in the vagina. These include:  Antibiotic medicines.  Poor hygiene.  Diaphragms, vaginal sponges, spermicides, birth control pills, and intrauterine devices (IUD).  Sexual intercourse.  Infection.  Uncontrolled diabetes.  A weakened immune system. SYMPTOMS  Symptoms can vary depending on the cause of the vaginitis. Common symptoms include:  Abnormal vaginal discharge.  The discharge is white, gray, or yellow with bacterial vaginosis.  The discharge is thick, white, and cheesy with a yeast infection.  The discharge is frothy and yellow or greenish with trichomoniasis.  A bad vaginal odor.  The odor is fishy with bacterial vaginosis.  Vaginal itching, pain, or swelling.  Painful intercourse.  Pain or burning when urinating. Sometimes, there are no symptoms. TREATMENT  Treatment will vary depending on the type of infection.   Bacterial  vaginosis and trichomoniasis are often treated with antibiotic creams or pills.  Yeast infections are often treated with antifungal medicines, such as vaginal creams or suppositories.  Viral vaginitis has no cure, but symptoms can be treated with medicines that relieve discomfort. Your sexual partner should be treated as well.  Atrophic vaginitis may be treated with an estrogen cream, pill, suppository, or vaginal ring. If vaginal dryness occurs, lubricants and moisturizing creams may help. You may be told to avoid scented soaps, sprays, or douches.  Allergic vaginitis treatment involves quitting the use of the product that is causing the problem. Vaginal creams can be used to treat the symptoms. HOME CARE INSTRUCTIONS   Take all medicines as directed by your caregiver.  Keep your genital area clean and dry. Avoid soap and only rinse the area with water.  Avoid douching. It can remove the healthy bacteria in the vagina.  Do not use tampons or have sexual intercourse until your vaginitis has been treated. Use sanitary pads while you have vaginitis.  Wipe from front to back. This avoids the spread of bacteria from the rectum to the vagina.  Let air reach your genital area.  Wear cotton underwear to decrease moisture buildup.  Avoid wearing underwear while you sleep until your vaginitis is gone.  Avoid tight pants and underwear or nylons without a cotton panel.  Take off wet clothing (especially bathing suits) as soon as possible.  Use mild, non-scented products. Avoid using irritants, such as:  Scented feminine sprays.  Fabric softeners.  Scented detergents.  Scented tampons.  Scented soaps or bubble baths.  Practice safe sex and use condoms. Condoms may prevent the spread of trichomoniasis and viral vaginitis. SEEK MEDICAL CARE IF:   You have abdominal pain.  You   have a fever or persistent symptoms for more than 2 3 days.  You have a fever and your symptoms suddenly  get worse. Document Released: 02/17/2007 Document Revised: 01/15/2012 Document Reviewed: 10/03/2011 [employer] Patient Information 2014 [employer].  

## 2013-10-03 NOTE — ED Notes (Signed)
Pt c/o abd cramping x4 days. Denies vaginal d/c, bleeding. Took home UPT, negative. Denies n/v.

## 2013-10-04 LAB — GC/CHLAMYDIA PROBE AMP
CT Probe RNA: NEGATIVE
GC Probe RNA: NEGATIVE

## 2013-10-05 LAB — URINE CULTURE
COLONY COUNT: NO GROWTH
CULTURE: NO GROWTH

## 2013-11-01 ENCOUNTER — Ambulatory Visit: Payer: Self-pay | Admitting: Internal Medicine

## 2014-01-09 ENCOUNTER — Emergency Department (HOSPITAL_COMMUNITY)
Admission: EM | Admit: 2014-01-09 | Discharge: 2014-01-09 | Disposition: A | Discharge status: Home / Self Care | Payer: Self-pay | Attending: Emergency Medicine | Admitting: Emergency Medicine

## 2014-01-09 ENCOUNTER — Encounter (HOSPITAL_COMMUNITY): Payer: Self-pay | Admitting: Emergency Medicine

## 2014-01-09 DIAGNOSIS — M545 Low back pain, unspecified: Secondary | ICD-10-CM | POA: Insufficient documentation

## 2014-01-09 DIAGNOSIS — X58XXXA Exposure to other specified factors, initial encounter: Secondary | ICD-10-CM | POA: Insufficient documentation

## 2014-01-09 DIAGNOSIS — M5380 Other specified dorsopathies, site unspecified: Secondary | ICD-10-CM | POA: Insufficient documentation

## 2014-01-09 DIAGNOSIS — Y92009 Unspecified place in unspecified non-institutional (private) residence as the place of occurrence of the external cause: Secondary | ICD-10-CM | POA: Insufficient documentation

## 2014-01-09 DIAGNOSIS — S335XXA Sprain of ligaments of lumbar spine, initial encounter: Secondary | ICD-10-CM | POA: Insufficient documentation

## 2014-01-09 DIAGNOSIS — Y939 Activity, unspecified: Secondary | ICD-10-CM | POA: Insufficient documentation

## 2014-01-09 DIAGNOSIS — S39012A Strain of muscle, fascia and tendon of lower back, initial encounter: Secondary | ICD-10-CM

## 2014-01-09 MED ORDER — OXYCODONE-ACETAMINOPHEN 5-325 MG PO TABS
1.0000 | ORAL_TABLET | Freq: Once | ORAL | Status: AC
  Filled 2014-01-09: qty 1

## 2014-01-09 MED ORDER — PREDNISONE 20 MG PO TABS
20.0000 mg | ORAL_TABLET | Freq: Once | ORAL | Status: AC
  Administered 2014-01-09: 20 mg via ORAL
  Filled 2014-01-09: qty 1

## 2014-01-09 MED ORDER — KETOROLAC TROMETHAMINE 60 MG/2ML IM SOLN
60.0000 mg | Freq: Once | INTRAMUSCULAR | Status: AC
  Administered 2014-01-09: 60 mg via INTRAMUSCULAR
  Filled 2014-01-09: qty 2

## 2014-01-09 MED ORDER — PREDNISONE 50 MG PO TABS: 50.0000 mg | ORAL_TABLET | Freq: Every day | ORAL | Status: DC

## 2014-01-09 MED ORDER — HYDROCODONE-ACETAMINOPHEN 5-325 MG PO TABS: 1.0000 | ORAL_TABLET | Freq: Four times a day (QID) | ORAL | Status: DC | PRN

## 2014-01-09 MED ORDER — PREDNISONE 20 MG PO TABS
60.0000 mg | ORAL_TABLET | Freq: Once | ORAL | Status: AC
  Administered 2014-01-09: 60 mg via ORAL
  Filled 2014-01-09: qty 3

## 2014-01-09 MED ORDER — CYCLOBENZAPRINE HCL 10 MG PO TABS: 10.0000 mg | ORAL_TABLET | Freq: Every day | ORAL | Status: DC

## 2014-01-09 NOTE — ED Provider Notes (Signed)
Medical screening examination/treatment/procedure(s) were performed by non-physician practitioner and as supervising physician I was immediately available for consultation/collaboration.   EKG Interpretation None      Devoria Albe, MD, Armando Gang   Ward Givens, MD 01/09/14 2251

## 2014-01-09 NOTE — ED Provider Notes (Signed)
CSN: [id number]     Arrival date & time 01/09/14  1623 History  This chart was scribed for Colleen Heading, PA-C, working with Janice Norrie, MD by Steva Colder, ED Scribe. The patient was seen in room WTR8/WTR8 at 5:08 PM.     Chief Complaint  Patient presents with  . Back Pain     The history is provided by the patient. No language interpreter was used.   HPI Comments: Shalah Estelle is a 28 y.o. female who presents to the Emergency Department complaining of lower back pain onset 1 week ago. She states that she has had pain like this before. She states that the pain is in her lower back. She states that she was at home when the pain began. She denies the pain radiating. She states that she does not have a specialist. She states that last time the back pain began she was informed that it may have been her sciatica nerve that was causing the problem. She denies dysuria, frequency, and any other associated symptoms.  History reviewed. No pertinent past medical history. History reviewed. No pertinent past surgical history. Family History  Problem Relation Age of Onset  . Hypertension Mother   . Diabetes Father    History  Substance Use Topics  . Smoking status: Never Smoker   . Smokeless tobacco: Not on file  . Alcohol Use: No   OB History   Grav Para Term Preterm Abortions TAB SAB Ect Mult Living                 Review of Systems  Genitourinary: Negative for dysuria and frequency.  Musculoskeletal: Positive for back pain (lower).  All other systems reviewed and are negative.     Allergies  Review of patient's allergies indicates no known allergies.  Home Medications   Prior to Admission medications   Medication Sig Start Date End Date Taking? Authorizing Provider  acetaminophen (TYLENOL) 325 MG tablet Take 650 mg by mouth every 6 (six) hours as needed (pain).    Historical Provider, MD  miconazole (MONISTAT 1 COMBINATION PACK) kit Place 1 each vaginally once.     Historical Provider, MD   BP 118/78  Pulse 85  Temp(Src) 98.9 F (37.2 C) (Oral)  Resp 18  SpO2 100%  LMP 01/02/2014  Physical Exam  Nursing note and vitals reviewed. Constitutional: She is oriented to person, place, and time. She appears well-developed and well-nourished. No distress.  HENT:  Head: Normocephalic and atraumatic.  Eyes: Pupils are equal, round, and reactive to light.  Pulmonary/Chest: Effort normal and breath sounds normal. No respiratory distress.  Musculoskeletal: Normal range of motion.       Lumbar back: She exhibits pain and spasm. She exhibits normal range of motion.       Back:  Diffused lower back pain on palpation.   Neurological: She is alert and oriented to person, place, and time. She exhibits normal muscle tone. Coordination normal.  Skin: Skin is warm and dry. No rash noted.  Psychiatric: She has a normal mood and affect. Her behavior is normal.    ED Course  Procedures (including critical care time) DIAGNOSTIC STUDIES: Oxygen Saturation is 100% on room air, normal by my interpretation.    COORDINATION OF CARE: 5:09 PM-Discussed treatment plan which includes pain medications and anti-inflammatory medications with pt at bedside and pt agreed to plan.     I personally performed the services described in this documentation, which was scribed in my presence. The  recorded information has been reviewed and is accurate. \   Brent General, PA-C 01/09/14 1754

## 2014-01-09 NOTE — ED Notes (Signed)
Pt reports lower back pain that started one week ago. Pt denies fall or injury. Pt denies urinary frequency or dysuria. Pt is A/O x4, in NAD, and vitals are WDL.

## 2014-01-09 NOTE — Discharge Instructions (Signed)
REturn here as needed. Follow up with the doctor provided as needed.

## 2014-01-18 ENCOUNTER — Encounter (HOSPITAL_COMMUNITY): Payer: Self-pay | Admitting: Emergency Medicine

## 2014-01-18 ENCOUNTER — Emergency Department (HOSPITAL_COMMUNITY)
Admission: EM | Admit: 2014-01-18 | Discharge: 2014-01-18 | Disposition: A | Discharge status: Home / Self Care | Payer: Self-pay | Attending: Emergency Medicine | Admitting: Emergency Medicine

## 2014-01-18 DIAGNOSIS — M545 Low back pain, unspecified: Secondary | ICD-10-CM | POA: Insufficient documentation

## 2014-01-18 DIAGNOSIS — M62830 Muscle spasm of back: Secondary | ICD-10-CM

## 2014-01-18 DIAGNOSIS — IMO0002 Reserved for concepts with insufficient information to code with codable children: Secondary | ICD-10-CM | POA: Insufficient documentation

## 2014-01-18 DIAGNOSIS — M62838 Other muscle spasm: Secondary | ICD-10-CM | POA: Insufficient documentation

## 2014-01-18 DIAGNOSIS — Z7982 Long term (current) use of aspirin: Secondary | ICD-10-CM | POA: Insufficient documentation

## 2014-01-18 DIAGNOSIS — Z79899 Other long term (current) drug therapy: Secondary | ICD-10-CM | POA: Insufficient documentation

## 2014-01-18 MED ORDER — NAPROXEN 500 MG PO TABS: 500.0000 mg | ORAL_TABLET | Freq: Two times a day (BID) | ORAL | Status: DC

## 2014-01-18 MED ORDER — TRAMADOL HCL 50 MG PO TABS: 50.0000 mg | ORAL_TABLET | Freq: Four times a day (QID) | ORAL | Status: DC | PRN

## 2014-01-18 NOTE — Discharge Instructions (Signed)
SEEK IMMEDIATE MEDICAL ATTENTION IF: New numbness, tingling, weakness, or problem with the use of your arms or legs.  Severe back pain not relieved with medications.  Change in bowel or bladder control.  Increasing pain in any areas of the body (such as chest or abdominal pain).  Shortness of breath, dizziness or fainting.  Nausea (feeling sick to your stomach), vomiting, fever, or sweats.   Please look up trigger point self care Follow up with a massage therapist or physical therapist. Follow up with an orthopedist if your symptoms do not resolve.   Trigger Point Injection Trigger points are areas where you have muscle pain. A trigger point injection is a shot given in the trigger point to relieve that pain. A trigger point might feel like a knot in your muscle. It hurts to press on a trigger point. Sometimes the pain spreads out (radiates) to other parts of the body. For example, pressing on a trigger point in your shoulder might cause pain in your arm or neck. You might have one trigger point. Or, you might have more than one. People often have trigger points in their upper back and lower back. They also occur often in the neck and shoulders. Pain from a trigger point lasts for a long time. It can make it hard to keep moving. You might not be able to do the exercise or physical therapy that could help you deal with the pain. A trigger point injection may help. It does not work for everyone. But, it may relieve your pain for a few days or a few months. A trigger point injection does not cure long-lasting (chronic) pain. LET YOUR CAREGIVER KNOW ABOUT:  Any allergies (especially to latex, lidocaine, or steroids).  Blood-thinning medicines that you take. These drugs can lead to bleeding or bruising after an injection. They include:  Aspirin.  Ibuprofen.  Clopidogrel.  Warfarin.  Other medicines you take. This includes all vitamins, herbs, eyedrops, over-the-counter medicines, and  creams.  Use of steroids.  Recent infections.  Past problems with numbing medicines.  Bleeding problems.  Surgeries you have had.  Other health problems. RISKS AND COMPLICATIONS A trigger point injection is a safe treatment. However, problems may develop, such as:  Minor side effects usually go away in 1 to 2 days. These may include:  Soreness.  Bruising.  Stiffness.  More serious problems are rare. But, they may include:  Bleeding under the skin (hematoma).  Skin infection.  Breaking off of the needle under your skin.  Lung puncture.  The trigger point injection may not work for you. BEFORE THE PROCEDURE You may need to stop taking any medicine that thins your blood. This is to prevent bleeding and bruising. Usually these medicines are stopped several days before the injection. No other preparation is needed. PROCEDURE  A trigger point injection can be given in your caregiver's office or in a clinic. Each injection takes 2 minutes or less.  Your caregiver will feel for trigger points. The caregiver may use a marker to circle the area for the injection.  The skin over the trigger point will be washed with a germ-killing (antiseptic) solution.  The caregiver pinches the spot for the injection.  Then, a very thin needle is used for the shot. You may feel pain or a twitching feeling when the needle enters the trigger point.  A numbing solution may be injected into the trigger point. Sometimes a drug to keep down swelling, redness, and warmth (inflammation) is  also injected.  Your caregiver moves the needle around the trigger zone until the tightness and twitching goes away.  After the injection, your caregiver may put gentle pressure over the injection site.  Then it is covered with a bandage. AFTER THE PROCEDURE  You can go right home after the injection.  The bandage can be taken off after a few hours.  You may feel sore and stiff for 1 to 2 days.  Go  back to your regular activities slowly. Your caregiver may ask you to stretch your muscles. Do not do anything that takes extra energy for a few days.  Follow your caregiver's instructions to manage and treat other pain. Document Released: 04/11/2011 Document Revised: 08/17/2012 Document Reviewed: 04/11/2011 [facility] Patient Information 2015 [place], [place]. This information is not intended to replace advice given to you by your health care provider. Make sure you discuss any questions you have with your health care provider.  [email or web address] [facility] - easy access, clinical specialists, convenient appointments, most insurances accepted. Call us today! [address] [place], [place] [address] [phone]

## 2014-01-18 NOTE — ED Notes (Signed)
Pt c/o lower back pain, came up here 2 weeks ago states she was giving meds but they are not working. Pt denies injury

## 2014-01-18 NOTE — ED Provider Notes (Signed)
CSN: [id number]     Arrival date & time 01/18/14  0841 History   None    Chief Complaint  Patient presents with  . Back Pain     (Consider location/radiation/quality/duration/timing/severity/associated sxs/prior Treatment) HPI   Colleen Fitzgerald is a 28 y.o. female who complains of low back pain for 2 week(s), positional with bending or lifting, with radiation down the R illiotibial band. Precipitating factors: heavy lifting at work. Prior history of back problems: recurrent self limited episodes of low back pain in the past. There is not numbness in the legs. Denies weakness, loss of bowel/bladder function or saddle anesthesia. Denies neck stiffness, headache, rash.  Denies fever or recent procedures to back.     History reviewed. No pertinent past medical history. History reviewed. No pertinent past surgical history. Family History  Problem Relation Age of Onset  . Hypertension Mother   . Diabetes Father    History  Substance Use Topics  . Smoking status: Never Smoker   . Smokeless tobacco: Not on file  . Alcohol Use: No   OB History   Grav Para Term Preterm Abortions TAB SAB Ect Mult Living                 Review of Systems  Ten systems reviewed and are negative for acute change, except as noted in the HPI.    Allergies  Review of patient's allergies indicates no known allergies.  Home Medications   Prior to Admission medications   Medication Sig Start Date End Date Taking? Authorizing Provider  acetaminophen (TYLENOL) 325 MG tablet Take 650 mg by mouth every 6 (six) hours as needed (pain).    Historical Provider, MD  Aspirin-Caffeine (BAYER BACK & BODY PAIN EX ST) 500-32.5 MG TABS Take 1 tablet by mouth every 6 (six) hours as needed (for pain).    Historical Provider, MD  cyclobenzaprine (FLEXERIL) 10 MG tablet Take 1 tablet (10 mg total) by mouth at bedtime. 01/09/14   Carlyle Dolly, PA-C  HYDROcodone-acetaminophen (NORCO/VICODIN) 5-325 MG per tablet Take 1  tablet by mouth every 6 (six) hours as needed for moderate pain. 01/09/14   Carlyle Dolly, PA-C  norethindrone-ethinyl estradiol (JUNEL FE,GILDESS FE,LOESTRIN FE) 1-20 MG-MCG tablet Take 1 tablet by mouth daily.    Historical Provider, MD  predniSONE (DELTASONE) 50 MG tablet Take 1 tablet (50 mg total) by mouth daily. 01/09/14   Jamesetta Orleans Lawyer, PA-C   BP 114/68  Pulse 89  Temp(Src) 98.8 F (37.1 C) (Oral)  Resp 20  SpO2 100%  LMP 01/02/2014 Physical Exam  Constitutional: She is oriented to person, place, and time. She appears well-developed and well-nourished. No distress.  HENT:  Head: Normocephalic and atraumatic.  Eyes: Conjunctivae are normal. No scleral icterus.  Neck: Normal range of motion.  Cardiovascular: Normal rate, regular rhythm and normal heart sounds.  Exam reveals no gallop and no friction rub.   No murmur heard. Pulmonary/Chest: Effort normal and breath sounds normal. No respiratory distress.  Abdominal: Soft. Bowel sounds are normal. She exhibits no distension and no mass. There is no tenderness. There is no guarding.  Musculoskeletal:  Lumbosacral spine area reveals no local tenderness or mass.  Painful and reduced LS ROM noted. Straight leg raise is negative. DTR's, motor strength and sensation normal, including heel and toe gait.  Peripheral pulses are palpable.   Neurological: She is alert and oriented to person, place, and time.  Skin: Skin is warm and dry. She is not diaphoretic.  ED Course  Procedures (including critical care time) Labs Review Labs Reviewed - No data to display  Imaging Review No results found.   EKG Interpretation None      MDM   Final diagnoses:  Back muscle spasm    Patient with back pain.  No neurological deficits and normal neuro exam.  Patient can walk but states is painful.  No loss of bowel or bladder control.  No concern for cauda equina.  No fever, night sweats, weight loss, h/o cancer, IVDU.  RICE protocol  and pain medicine indicated and discussed with patient.    For acute pain, rest, intermittent application of heat (do not sleep on heating pad), analgesics and muscle relaxants are recommended. Discussed longer term treatment plan of prn NSAID's and discussed a home back care exercise program with flexion exercise routine. Proper lifting with avoidance of heavy lifting discussed. Consider Physical Therapy and XRay studies if not improving. Call or return to clinic prn if these symptoms worsen or fail to improve as anticipated.  Arthor Captain, PA-C 01/18/14 1035

## 2014-01-18 NOTE — Progress Notes (Signed)
[facility] Community Liaison Stacy,  ° °Provided pt with a list of primary care resources and a [facility] [employer] Card application to help patient establish primary care.  °

## 2014-01-18 NOTE — ED Provider Notes (Signed)
  Medical screening examination/treatment/procedure(s) were performed by non-physician practitioner and as supervising physician I was immediately available for consultation/collaboration.    Gerhard Munch, MD 01/18/14 [phone]

## 2014-04-16 ENCOUNTER — Emergency Department (HOSPITAL_COMMUNITY): Payer: Self-pay

## 2014-04-16 ENCOUNTER — Encounter (HOSPITAL_COMMUNITY): Payer: Self-pay | Admitting: Emergency Medicine

## 2014-04-16 ENCOUNTER — Emergency Department (HOSPITAL_COMMUNITY)
Admission: EM | Admit: 2014-04-16 | Discharge: 2014-04-16 | Disposition: A | Discharge status: Home / Self Care | Payer: Self-pay | Attending: Emergency Medicine | Admitting: Emergency Medicine

## 2014-04-16 DIAGNOSIS — R05 Cough: Secondary | ICD-10-CM

## 2014-04-16 DIAGNOSIS — R059 Cough, unspecified: Secondary | ICD-10-CM

## 2014-04-16 DIAGNOSIS — J069 Acute upper respiratory infection, unspecified: Secondary | ICD-10-CM | POA: Insufficient documentation

## 2014-04-16 DIAGNOSIS — I10 Essential (primary) hypertension: Secondary | ICD-10-CM | POA: Insufficient documentation

## 2014-04-16 DIAGNOSIS — Z791 Long term (current) use of non-steroidal anti-inflammatories (NSAID): Secondary | ICD-10-CM | POA: Insufficient documentation

## 2014-04-16 DIAGNOSIS — E119 Type 2 diabetes mellitus without complications: Secondary | ICD-10-CM | POA: Insufficient documentation

## 2014-04-16 MED ORDER — HYDROCODONE-HOMATROPINE 5-1.5 MG/5ML PO SYRP: 5.0000 mL | ORAL_SOLUTION | Freq: Four times a day (QID) | ORAL | Status: DC | PRN

## 2014-04-16 NOTE — ED Provider Notes (Signed)
CSN: [id number]     Arrival date & time 04/16/14  1746 History   First MD Initiated Contact with Patient 04/16/14 1909     This chart was scribed for non-physician practitioner, Teressa LowerVrinda Neelah Mannings, NP working with Tilden FossaElizabeth Rees, MD by Arlan OrganAshley Leger, ED Scribe. This patient was seen in room WTR5/WTR5 and the patient's care was started at 7:18 PM.   Chief Complaint  Patient presents with  . Shortness of Breath  . Cough   The history is provided by the patient. No language interpreter was used.    HPI Comments: Colleen Fitzgerald is a 28 y.o. female with a PMHx of HTN and diabetes who presents to the Emergency Department complaining of a constant productive cough consisting of yellow mucous x 2 days that is unchanged at this time. Pt also reports SOB. She has tried ActuaryTC Alkalizer without any improvement for symptoms. No recent fever or chills. No history of asthma. She denies any Tobacco use. No known allergies to medications.  History reviewed. No pertinent past medical history. History reviewed. No pertinent past surgical history. Family History  Problem Relation Age of Onset  . Hypertension Mother   . Diabetes Father    History  Substance Use Topics  . Smoking status: Never Smoker   . Smokeless tobacco: Not on file  . Alcohol Use: No   OB History    No data available     Review of Systems  Constitutional: Negative for fever and chills.  Respiratory: Positive for cough and shortness of breath.   All other systems reviewed and are negative.     Allergies  Review of patient's allergies indicates no known allergies.  Home Medications   Prior to Admission medications   Medication Sig Start Date End Date Taking? Authorizing Provider  acetaminophen (TYLENOL) 325 MG tablet Take 650 mg by mouth every 6 (six) hours as needed (pain).    Historical Provider, MD  Aspirin-Caffeine (BAYER BACK & BODY PAIN EX ST) 500-32.5 MG TABS Take 1 tablet by mouth every 6 (six) hours as needed (for  pain).    Historical Provider, MD  cyclobenzaprine (FLEXERIL) 10 MG tablet Take 1 tablet (10 mg total) by mouth at bedtime. 01/09/14   Carlyle Dollyhristopher W Lawyer, PA-C  HYDROcodone-acetaminophen (NORCO/VICODIN) 5-325 MG per tablet Take 1 tablet by mouth every 6 (six) hours as needed for moderate pain. 01/09/14   Jamesetta Orleanshristopher W Lawyer, PA-C  naproxen (NAPROSYN) 500 MG tablet Take 1 tablet (500 mg total) by mouth 2 (two) times daily with a meal. 01/18/14   Arthor CaptainAbigail Harris, PA-C  norethindrone-ethinyl estradiol (JUNEL FE,GILDESS FE,LOESTRIN FE) 1-20 MG-MCG tablet Take 1 tablet by mouth at bedtime.     Historical Provider, MD  traMADol (ULTRAM) 50 MG tablet Take 1 tablet (50 mg total) by mouth every 6 (six) hours as needed. 01/18/14   Arthor CaptainAbigail Harris, PA-C   Triage Vitals: BP 122/89 mmHg  Pulse 82  Temp(Src) 98.7 F (37.1 C) (Oral)  Resp 18  SpO2 97%  LMP 03/21/2014   Physical Exam  Constitutional: She is oriented to person, place, and time. She appears well-developed and well-nourished.  HENT:  Head: Normocephalic.  Nose: Rhinorrhea present.  Mouth/Throat: Posterior oropharyngeal erythema present.  Eyes: EOM are normal.  Neck: Normal range of motion.  Pulmonary/Chest: Effort normal.  Abdominal: She exhibits no distension.  Musculoskeletal: Normal range of motion.  Neurological: She is alert and oriented to person, place, and time.  Psychiatric: She has a normal mood and affect.  Nursing  note and vitals reviewed.   ED Course  Procedures (including critical care time)  DIAGNOSTIC STUDIES: Oxygen Saturation is 97% on RA, Normal by my interpretation.    COORDINATION OF CARE: 7:18 PM- Will order CXR and EKG. Discussed treatment plan with pt at bedside and pt agreed to plan.     Labs Review Labs Reviewed - No data to display  Imaging Review Dg Chest 2 View  04/16/2014   CLINICAL DATA:  Productive cough and shortness of breath  EXAM: CHEST  2 VIEW  COMPARISON:  03/15/2012  FINDINGS: Cardiac  shadow is within normal limits. The lungs are well aerated bilaterally. No focal infiltrate or sizable effusion is seen. No bony abnormality is noted.  IMPRESSION: No active cardiopulmonary disease.   Electronically Signed   By: Alcide CleverMark  Lukens M.D.   On: 04/16/2014 18:45     EKG Interpretation None      MDM   Final diagnoses:  Cough  URI (upper respiratory infection)    No infection noted on x-ray. Will treat symptomatically for a cough.  I personally performed the services described in this documentation, which was scribed in my presence. The recorded information has been reviewed and is accurate.    Teressa LowerVrinda Onelia Cadmus, NP 04/16/14 1936  Tilden FossaElizabeth Rees, MD 04/17/14 [phone]

## 2014-04-16 NOTE — Discharge Instructions (Signed)
Upper Respiratory Infection, Adult An upper respiratory infection (URI) is also sometimes known as the common cold. The upper respiratory tract includes the nose, sinuses, throat, trachea, and bronchi. Bronchi are the airways leading to the lungs. Most people improve within 1 week, but symptoms can last up to 2 weeks. A residual cough may last even longer.  CAUSES Many different viruses can infect the tissues lining the upper respiratory tract. The tissues become irritated and inflamed and often become very moist. Mucus production is also common. A cold is contagious. You can easily spread the virus to others by oral contact. This includes kissing, sharing a glass, coughing, or sneezing. Touching your mouth or nose and then touching a surface, which is then touched by another person, can also spread the virus. SYMPTOMS  Symptoms typically develop 1 to 3 days after you come in contact with a cold virus. Symptoms vary from person to person. They may include:  Runny nose.  Sneezing.  Nasal congestion.  Sinus irritation.  Sore throat.  Loss of voice (laryngitis).  Cough.  Fatigue.  Muscle aches.  Loss of appetite.  Headache.  Low-grade fever. DIAGNOSIS  You might diagnose your own cold based on familiar symptoms, since most people get a cold 2 to 3 times a year. Your caregiver can confirm this based on your exam. Most importantly, your caregiver can check that your symptoms are not due to another disease such as strep throat, sinusitis, pneumonia, asthma, or epiglottitis. Blood tests, throat tests, and X-rays are not necessary to diagnose a common cold, but they may sometimes be helpful in excluding other more serious diseases. Your caregiver will decide if any further tests are required. RISKS AND COMPLICATIONS  You may be at risk for a more severe case of the common cold if you smoke cigarettes, have chronic heart disease (such as heart failure) or lung disease (such as asthma), or if  you have a weakened immune system. The very young and very old are also at risk for more serious infections. Bacterial sinusitis, middle ear infections, and bacterial pneumonia can complicate the common cold. The common cold can worsen asthma and chronic obstructive pulmonary disease (COPD). Sometimes, these complications can require emergency medical care and may be life-threatening. PREVENTION  The best way to protect against getting a cold is to practice good hygiene. Avoid oral or hand contact with people with cold symptoms. Wash your hands often if contact occurs. There is no clear evidence that vitamin C, vitamin E, echinacea, or exercise reduces the chance of developing a cold. However, it is always recommended to get plenty of rest and practice good nutrition. TREATMENT  Treatment is directed at relieving symptoms. There is no cure. Antibiotics are not effective, because the infection is caused by a virus, not by bacteria. Treatment may include:  Increased fluid intake. Sports drinks offer valuable electrolytes, sugars, and fluids.  Breathing heated mist or steam (vaporizer or shower).  Eating chicken soup or other clear broths, and maintaining good nutrition.  Getting plenty of rest.  Using gargles or lozenges for comfort.  Controlling fevers with ibuprofen or acetaminophen as directed by your caregiver.  Increasing usage of your inhaler if you have asthma. Zinc gel and zinc lozenges, taken in the first 24 hours of the common cold, can shorten the duration and lessen the severity of symptoms. Pain medicines may help with fever, muscle aches, and throat pain. A variety of non-prescription medicines are available to treat congestion and runny nose. Your caregiver   can make recommendations and may suggest nasal or lung inhalers for other symptoms.  HOME CARE INSTRUCTIONS   Only take over-the-counter or prescription medicines for pain, discomfort, or fever as directed by your  caregiver.  Use a warm mist humidifier or inhale steam from a shower to increase air moisture. This may keep secretions moist and make it easier to breathe.  Drink enough water and fluids to keep your urine clear or pale yellow.  Rest as needed.  Return to work when your temperature has returned to normal or as your caregiver advises. You may need to stay home longer to avoid infecting others. You can also use a face mask and careful hand washing to prevent spread of the virus. SEEK MEDICAL CARE IF:   After the first few days, you feel you are getting worse rather than better.  You need your caregiver's advice about medicines to control symptoms.  You develop chills, worsening shortness of breath, or brown or red sputum. These may be signs of pneumonia.  You develop yellow or brown nasal discharge or pain in the face, especially when you bend forward. These may be signs of sinusitis.  You develop a fever, swollen neck glands, pain with swallowing, or white areas in the back of your throat. These may be signs of strep throat. SEEK IMMEDIATE MEDICAL CARE IF:   You have a fever.  You develop severe or persistent headache, ear pain, sinus pain, or chest pain.  You develop wheezing, a prolonged cough, cough up blood, or have a change in your usual mucus (if you have chronic lung disease).  You develop sore muscles or a stiff neck. Document Released: 10/16/2000 Document Revised: 07/15/2011 Document Reviewed: 07/28/2013 Exit[employer] Patient Information 2015 [employer]. This information is not intended to replace advice given to you by your health care provider. Make sure you discuss any questions you have with your health care provider.  

## 2014-04-16 NOTE — ED Notes (Signed)
Pt from home c/o a cough x 1 week and shortness of breath x 2 days. Productive cough with yellow expectorant. Patient lung sound clear.

## 2015-02-14 ENCOUNTER — Emergency Department (HOSPITAL_COMMUNITY)
Admission: EM | Admit: 2015-02-14 | Discharge: 2015-02-14 | Disposition: A | Discharge status: Home / Self Care | Payer: 59 | Attending: Emergency Medicine | Admitting: Emergency Medicine

## 2015-02-14 ENCOUNTER — Emergency Department (HOSPITAL_COMMUNITY): Payer: 59

## 2015-02-14 ENCOUNTER — Encounter (HOSPITAL_COMMUNITY): Payer: Self-pay | Admitting: Emergency Medicine

## 2015-02-14 DIAGNOSIS — W228XXA Striking against or struck by other objects, initial encounter: Secondary | ICD-10-CM | POA: Diagnosis not present

## 2015-02-14 DIAGNOSIS — Y9389 Activity, other specified: Secondary | ICD-10-CM | POA: Diagnosis not present

## 2015-02-14 DIAGNOSIS — Z79818 Long term (current) use of other agents affecting estrogen receptors and estrogen levels: Secondary | ICD-10-CM | POA: Insufficient documentation

## 2015-02-14 DIAGNOSIS — Y998 Other external cause status: Secondary | ICD-10-CM | POA: Diagnosis not present

## 2015-02-14 DIAGNOSIS — Y9289 Other specified places as the place of occurrence of the external cause: Secondary | ICD-10-CM | POA: Diagnosis not present

## 2015-02-14 DIAGNOSIS — M25531 Pain in right wrist: Secondary | ICD-10-CM

## 2015-02-14 DIAGNOSIS — S6991XA Unspecified injury of right wrist, hand and finger(s), initial encounter: Secondary | ICD-10-CM | POA: Diagnosis not present

## 2015-02-14 MED ORDER — IBUPROFEN 800 MG PO TABS: 800.0000 mg | ORAL_TABLET | Freq: Three times a day (TID) | ORAL | Status: DC

## 2015-02-14 NOTE — ED Provider Notes (Signed)
CSN: [id number]     Arrival date & time 02/14/15  1532 History  By signing my name below, I, Colleen Fitzgerald, attest that this documentation has been prepared under the direction and in the presence of Melburn Hake, PA-C Electronically Signed: Jarvis Fitzgerald, ED Scribe. 02/14/2015. 5:08 PM.     Chief Complaint  Patient presents with  . Wrist Pain   The history is provided by the patient. No language interpreter was used.   HPI Comments: Colleen Fitzgerald is a 29 y.o. female who presents to the Emergency Department complaining of constant, moderate, aching and stiff, right wrist pain onset 2 weeks that has been gradually worsening over the past 3 days. Pt reports she hit her right wrist on an object 2 weeks ago but states she did not hit it that hard and did not think much out of the injury. She reports intermittent swelling to the hand but that there is no active swelling at this time. Pt endorses the pain is worse with movement and rotation of the wrist. She has been taking Advil at home with no significant relief. She denies any prior injury to the wrist of hand. Pt is right hand dominant.  She has an PCP that follows her for her medical care. Pt denies any bruising, numbness, or tingling.  History reviewed. No pertinent past medical history. History reviewed. No pertinent past surgical history. Family History  Problem Relation Age of Onset  . Hypertension Mother   . Diabetes Father    Social History  Substance Use Topics  . Smoking status: Never Smoker   . Smokeless tobacco: None  . Alcohol Use: No   OB History    No data available     Review of Systems  Musculoskeletal: Positive for arthralgias. Negative for joint swelling.  Skin: Negative for color change.  Neurological: Negative for weakness and numbness.      Allergies  Review of patient's allergies indicates no known allergies.  Home Medications   Prior to Admission medications   Medication Sig Start Date End Date  Taking? Authorizing Provider  ibuprofen (ADVIL,MOTRIN) 200 MG tablet Take 400 mg by mouth every 6 (six) hours as needed for moderate pain.   Yes Historical Provider, MD  norethindrone-ethinyl estradiol (JUNEL FE,GILDESS FE,LOESTRIN FE) 1-20 MG-MCG tablet Take 1 tablet by mouth at bedtime.    Yes Historical Provider, MD  cyclobenzaprine (FLEXERIL) 10 MG tablet Take 1 tablet (10 mg total) by mouth at bedtime. Patient not taking: Reported on 02/14/2015 01/09/14   Charlestine Night, PA-C  HYDROcodone-acetaminophen (NORCO/VICODIN) 5-325 MG per tablet Take 1 tablet by mouth every 6 (six) hours as needed for moderate pain. Patient not taking: Reported on 02/14/2015 01/09/14   Charlestine Night, PA-C  HYDROcodone-homatropine (HYDROMET) 5-1.5 MG/5ML syrup Take 5 mLs by mouth every 6 (six) hours as needed for cough. Patient not taking: Reported on 02/14/2015 04/16/14   Teressa Lower, NP  naproxen (NAPROSYN) 500 MG tablet Take 1 tablet (500 mg total) by mouth 2 (two) times daily with a meal. Patient not taking: Reported on 02/14/2015 01/18/14   Arthor Captain, PA-C  traMADol (ULTRAM) 50 MG tablet Take 1 tablet (50 mg total) by mouth every 6 (six) hours as needed. Patient not taking: Reported on 02/14/2015 01/18/14   Arthor Captain, PA-C   Triage Vitals: BP 127/71 mmHg  Pulse 80  Temp(Src) 98.4 F (36.9 C)  Resp 18  SpO2 100%  LMP 01/15/2015  Physical Exam  Constitutional: She is oriented to person, place,  and time. She appears well-developed and well-nourished. No distress.  HENT:  Head: Normocephalic and atraumatic.  Eyes: Conjunctivae and EOM are normal.  Neck: Neck supple. No tracheal deviation present.  Cardiovascular: Normal rate.   Pulmonary/Chest: Effort normal. No respiratory distress.  Musculoskeletal: Normal range of motion.       Right wrist: She exhibits tenderness (at right lateral wrist). She exhibits normal range of motion, no bony tenderness, no swelling, no effusion, no crepitus,  no deformity and no laceration.  Full ROM of right wrist, hand and fingers. 5/5 strength. Sensation intact. 2+ radial pulses. No swelling, ecchymosis, or deformity. No sniff box tenderness.  Neurological: She is alert and oriented to person, place, and time.  Skin: Skin is warm and dry.  Psychiatric: She has a normal mood and affect. Her behavior is normal.  Nursing note and vitals reviewed.   ED Course  Procedures (including critical care time)  DIAGNOSTIC STUDIES: Oxygen Saturation is 100% on RA, normal by my interpretation.    COORDINATION OF CARE: 3:47 PM- Will order imaging of right wrist.  Pt advised of plan for treatment and pt agrees.   Labs Review Labs Reviewed - No data to display  Imaging Review Dg Wrist Complete Right  02/14/2015   CLINICAL DATA:  Right wrist pain.  EXAM: RIGHT WRIST - COMPLETE 3+ VIEW  COMPARISON:  None.  FINDINGS: There is no evidence of fracture or dislocation. There is no evidence of arthropathy or other focal bone abnormality. Soft tissues are unremarkable.  IMPRESSION: No acute osseous injury of the right wrist.   Electronically Signed   By: Elige Ko   On: 02/14/2015 16:29   I have personally reviewed and evaluated these images  as part of my medical decision-making.  Filed Vitals:   02/14/15 1545  BP: 127/71  Pulse: 80  Temp: 98.4 F (36.9 C)  Resp: 18     MDM   Final diagnoses:  Right wrist pain   Patient presents with right wrist pain that has been present for a couple weeks and worsened over the past few days. Reports hitting right wrist on object approximately 2 weeks ago and endorses mild swelling that has since resolved. No other prior injuries or trauma reported. Exam reveals mild tenderness to right lateral wrist, no swelling, no ecchymoses, no contusions. Full range of motion of right wrist hand and fingers. Right upper extremity is neurovascularly intact. Right wrist x-ray negative. Plan to discharge patient home with pain  medicine. Advised patient to also rest right arm and use ice and elevation for pain relief. Advised patient to follow up with her primary care provider.  Evaluation does not show pathology requring ongoing emergent intervention or admission. Pt is hemodynamically stable and mentating appropriately. Discussed findings/results and plan with patient/guardian, who agrees with plan. All questions answered. Return precautions discussed and outpatient follow up given.    I personally performed the services described in this documentation, which was scribed in my presence. The recorded information has been reviewed and is accurate.     Satira Sark Sorgho, [place] 02/14/15 1729  Lavera Guise, MD 02/15/15 1200

## 2015-02-14 NOTE — Discharge Instructions (Signed)
Please take your prescriptions as prescribed. You may also use ice and elevate your right wrist for pain relief. Please follow-up with a primary care provider listed in the resource guide provided to you at discharge Return to the emergency department if symptoms worsen.

## 2015-02-14 NOTE — ED Notes (Signed)
Pt states a couple weeks ago she hit her right wrist on something but didn't think much of it. States the pain has been steadily getting worse since she hit it. Pulse, movement, sensation still intact, no obvious bruising/swelling/injury observed.

## 2015-09-03 ENCOUNTER — Telehealth: Payer: Self-pay | Admitting: *Deleted

## 2015-09-03 ENCOUNTER — Emergency Department (HOSPITAL_COMMUNITY)
Admission: EM | Admit: 2015-09-03 | Discharge: 2015-09-03 | Disposition: A | Discharge status: Home / Self Care | Payer: BLUE CROSS/BLUE SHIELD | Attending: Emergency Medicine | Admitting: Emergency Medicine

## 2015-09-03 ENCOUNTER — Encounter (HOSPITAL_COMMUNITY): Payer: Self-pay | Admitting: Emergency Medicine

## 2015-09-03 DIAGNOSIS — Z791 Long term (current) use of non-steroidal anti-inflammatories (NSAID): Secondary | ICD-10-CM | POA: Insufficient documentation

## 2015-09-03 DIAGNOSIS — R51 Headache: Secondary | ICD-10-CM | POA: Insufficient documentation

## 2015-09-03 DIAGNOSIS — M545 Low back pain, unspecified: Secondary | ICD-10-CM

## 2015-09-03 DIAGNOSIS — Z79899 Other long term (current) drug therapy: Secondary | ICD-10-CM | POA: Insufficient documentation

## 2015-09-03 DIAGNOSIS — Z793 Long term (current) use of hormonal contraceptives: Secondary | ICD-10-CM | POA: Insufficient documentation

## 2015-09-03 DIAGNOSIS — R519 Headache, unspecified: Secondary | ICD-10-CM

## 2015-09-03 LAB — URINALYSIS, ROUTINE W REFLEX MICROSCOPIC
Bilirubin Urine: NEGATIVE
GLUCOSE, UA: NEGATIVE mg/dL
HGB URINE DIPSTICK: NEGATIVE
Ketones, ur: NEGATIVE mg/dL
Nitrite: NEGATIVE
PH: 6.5 (ref 5.0–8.0)
Protein, ur: NEGATIVE mg/dL
SPECIFIC GRAVITY, URINE: 1.015 (ref 1.005–1.030)

## 2015-09-03 LAB — URINE MICROSCOPIC-ADD ON

## 2015-09-03 LAB — PREGNANCY, URINE: Preg Test, Ur: NEGATIVE

## 2015-09-03 MED ORDER — DIPHENHYDRAMINE HCL 50 MG/ML IJ SOLN
25.0000 mg | Freq: Once | INTRAMUSCULAR | Status: AC
Start: 2015-09-03 — End: 2015-09-03
  Administered 2015-09-03: 25 mg via INTRAVENOUS
  Filled 2015-09-03: qty 1

## 2015-09-03 MED ORDER — SODIUM CHLORIDE 0.9 % IV SOLN
INTRAVENOUS | Status: DC
  Administered 2015-09-03: 1000 mL via INTRAVENOUS

## 2015-09-03 MED ORDER — IBUPROFEN 600 MG PO TABS: 600.0000 mg | ORAL_TABLET | Freq: Four times a day (QID) | ORAL | Status: DC | PRN

## 2015-09-03 MED ORDER — KETOROLAC TROMETHAMINE 30 MG/ML IJ SOLN
30.0000 mg | Freq: Once | INTRAMUSCULAR | Status: AC
  Administered 2015-09-03: 30 mg via INTRAVENOUS
  Filled 2015-09-03: qty 1

## 2015-09-03 MED ORDER — ORPHENADRINE CITRATE ER 100 MG PO TB12: 100.0000 mg | ORAL_TABLET | Freq: Two times a day (BID) | ORAL | Status: DC

## 2015-09-03 MED ORDER — METOCLOPRAMIDE HCL 5 MG/ML IJ SOLN
10.0000 mg | Freq: Once | INTRAMUSCULAR | Status: AC
  Administered 2015-09-03: 10 mg via INTRAVENOUS
  Filled 2015-09-03: qty 2

## 2015-09-03 NOTE — Discharge Instructions (Signed)
General Headache Without Cause A headache is pain or discomfort felt around the head or neck area. The specific cause of a headache may not be found. There are many causes and types of headaches. A few common ones are:  Tension headaches.  Migraine headaches.  Cluster headaches.  Chronic daily headaches. HOME CARE INSTRUCTIONS  Watch your condition for any changes. Take these steps to help with your condition: Managing Pain  Take over-the-counter and prescription medicines only as told by your health care provider.  Lie down in a dark, quiet room when you have a headache.  If directed, apply ice to the head and neck area:  Put ice in a plastic bag.  Place a towel between your skin and the bag.  Leave the ice on for 20 minutes, 2-3 times per day.  Use a heating pad or hot shower to apply heat to the head and neck area as told by your health care provider.  Keep lights dim if bright lights bother you or make your headaches worse. Eating and Drinking  Eat meals on a regular schedule.  Limit alcohol use.  Decrease the amount of caffeine you drink, or stop drinking caffeine. General Instructions  Keep all follow-up visits as told by your health care provider. This is important.  Keep a headache journal to help find out what may trigger your headaches. For example, write down:  What you eat and drink.  How much sleep you get.  Any change to your diet or medicines.  Try massage or other relaxation techniques.  Limit stress.  Sit up straight, and do not tense your muscles.  Do not use tobacco products, including cigarettes, chewing tobacco, or e-cigarettes. If you need help quitting, ask your health care provider.  Exercise regularly as told by your health care provider.  Sleep on a regular schedule. Get 7-9 hours of sleep, or the amount recommended by your health care provider. SEEK MEDICAL CARE IF:   Your symptoms are not helped by medicine.  You have a  headache that is different from the usual headache.  You have nausea or you vomit.  You have a fever. SEEK IMMEDIATE MEDICAL CARE IF:   Your headache becomes severe.  You have repeated vomiting.  You have a stiff neck.  You have a loss of vision.  You have problems with speech.  You have pain in the eye or ear.  You have muscular weakness or loss of muscle control.  You lose your balance or have trouble walking.  You feel faint or pass out.  You have confusion.   This information is not intended to replace advice given to you by your health care provider. Make sure you discuss any questions you have with your health care provider.   Document Released: 04/22/2005 Document Revised: 01/11/2015 Document Reviewed: 08/15/2014 Elsevier Interactive Patient Education 2016 [employer] Back Pain, Adult Back pain is very common in adults.The cause of back pain is rarely dangerous and the pain often gets better over time.The cause of your back pain may not be known. Some common causes of back pain include:  Strain of the muscles or ligaments supporting the spine.  Wear and tear (degeneration) of the spinal disks.  Arthritis.  Direct injury to the back. For many people, back pain may return. Since back pain is rarely dangerous, most people can learn to manage this condition on their own. HOME CARE INSTRUCTIONS Watch your back pain for any changes. The following actions may help to  lessen any discomfort you are feeling:  Remain active. It is stressful on your back to sit or stand in one place for long periods of time. Do not sit, drive, or stand in one place for more than 30 minutes at a time. Take short walks on even surfaces as soon as you are able.Try to increase the length of time you walk each day.  Exercise regularly as directed by your health care provider. Exercise helps your back heal faster. It also helps avoid future injury by keeping your muscles strong and  flexible.  Do not stay in bed.Resting more than 1-2 days can delay your recovery.  Pay attention to your body when you bend and lift. The most comfortable positions are those that put less stress on your recovering back. Always use proper lifting techniques, including:  Bending your knees.  Keeping the load close to your body.  Avoiding twisting.  Find a comfortable position to sleep. Use a firm mattress and lie on your side with your knees slightly bent. If you lie on your back, put a pillow under your knees.  Avoid feeling anxious or stressed.Stress increases muscle tension and can worsen back pain.It is important to recognize when you are anxious or stressed and learn ways to manage it, such as with exercise.  Take medicines only as directed by your health care provider. Over-the-counter medicines to reduce pain and inflammation are often the most helpful.Your health care provider may prescribe muscle relaxant drugs.These medicines help dull your pain so you can more quickly return to your normal activities and healthy exercise.  Apply ice to the injured area:  Put ice in a plastic bag.  Place a towel between your skin and the bag.  Leave the ice on for 20 minutes, 2-3 times a day for the first 2-3 days. After that, ice and heat may be alternated to reduce pain and spasms.  Maintain a healthy weight. Excess weight puts extra stress on your back and makes it difficult to maintain good posture. SEEK MEDICAL CARE IF:  You have pain that is not relieved with rest or medicine.  You have increasing pain going down into the legs or buttocks.  You have pain that does not improve in one week.  You have night pain.  You lose weight.  You have a fever or chills. SEEK IMMEDIATE MEDICAL CARE IF:   You develop new bowel or bladder control problems.  You have unusual weakness or numbness in your arms or legs.  You develop nausea or vomiting.  You develop abdominal  pain.  You feel faint.   This information is not intended to replace advice given to you by your health care provider. Make sure you discuss any questions you have with your health care provider.   Document Released: 04/22/2005 Document Revised: 05/13/2014 Document Reviewed: 08/24/2013 Elsevier Interactive Patient Education [employer]

## 2015-09-03 NOTE — ED Notes (Signed)
Pt reports ongoing HA for past 2 weeks. Pt reports pressure is worse today. Pain is behind eyes with some sensitivity to light and sound. Also reports ongoing lower back pain that she has been seen for before.

## 2015-09-03 NOTE — Telephone Encounter (Signed)
Changed Norflex RX from NORFLEX 100 mg po Bid #30 to NORFLEX ER 100 mg po BID #30 at the recommendation of Pharm-D. No further CM Needs.

## 2015-09-03 NOTE — ED Notes (Signed)
Awake. Verbally responsive. A/O x4. Resp even and unlabored. No audible adventitious breath sounds noted. ABC's intact.  

## 2015-09-03 NOTE — ED Provider Notes (Signed)
CSN: [id number]     Arrival date & time 09/03/15  0754 History   First MD Initiated Contact with Patient 09/03/15 [phone]     Chief Complaint  Patient presents with  . Headache  . Back Pain     (Consider location/radiation/quality/duration/timing/severity/associated sxs/prior Treatment) HPI Patient poor she has had a history of headaches intermittently for several years. She reports however she has developed a headache this been fairly persistent for almost 2 weeks duration. It's a pressure behind her forehead and eyes. No visual changes. No nausea or vomiting. No fever. She denies significant sensitivity to light. (She did endorse this to triage note). Patient denies her to any specific pattern to when the headache is better or worse. No activity seemed to make it much better worse. No neck stiffness.  Patient reports secondary complaint of lower back pain. She reports she's had this for a number of months. She describes general aching in her lumbar back. No weakness numbness or tingling to the legs. No gait incoordination. No abdominal pain. No abnormal vaginal discharge or bleeding. History reviewed. No pertinent past medical history. History reviewed. No pertinent past surgical history. Family History  Problem Relation Age of Onset  . Hypertension Mother   . Diabetes Father    Social History  Substance Use Topics  . Smoking status: Never Smoker   . Smokeless tobacco: None  . Alcohol Use: No   OB History    No data available     Review of Systems 10 Systems reviewed and are negative for acute change except as noted in the HPI.    Allergies  Review of patient's allergies indicates no known allergies.  Home Medications   Prior to Admission medications   Medication Sig Start Date End Date Taking? Authorizing Provider  acetaminophen (TYLENOL) 500 MG tablet Take 1,000 mg by mouth every 6 (six) hours as needed for headache.   Yes Historical Provider, MD  ibuprofen  (ADVIL,MOTRIN) 200 MG tablet Take 400 mg by mouth every 6 (six) hours as needed for headache.   Yes Historical Provider, MD  norethindrone-ethinyl estradiol (JUNEL FE,GILDESS FE,LOESTRIN FE) 1-20 MG-MCG tablet Take 1 tablet by mouth at bedtime.    Yes Historical Provider, MD  ibuprofen (ADVIL,MOTRIN) 600 MG tablet Take 1 tablet (600 mg total) by mouth every 6 (six) hours as needed. 09/03/15   Arby Barrette, MD  orphenadrine (NORFLEX) 100 MG tablet Take 1 tablet (100 mg total) by mouth 2 (two) times daily. 09/03/15   Arby Barrette, MD   BP 115/80 mmHg  Pulse 76  Temp(Src) 98.5 F (36.9 C) (Oral)  Resp 16  SpO2 99% Physical Exam  Constitutional: She is oriented to person, place, and time. She appears well-developed and well-nourished.  HENT:  Head: Normocephalic and atraumatic.  Right Ear: External ear normal.  Left Ear: External ear normal.  Nose: Nose normal.  Mouth/Throat: Oropharynx is clear and moist.  Bilateral TMs are normal. Nares are normal and patent. Oral cavity mucous rise pink and moist posterior Chalmers Guest while he patent. Dentition intact.  Eyes: EOM are normal. Pupils are equal, round, and reactive to light.  Neck: Neck supple.  Cardiovascular: Normal rate, regular rhythm, normal heart sounds and intact distal pulses.   Pulmonary/Chest: Effort normal and breath sounds normal.  Abdominal: Soft. Bowel sounds are normal. She exhibits no distension. There is no tenderness.  Musculoskeletal: Normal range of motion. She exhibits no edema.  Neurological: She is alert and oriented to person, place, and time. She  has normal strength. No cranial nerve deficit. She exhibits normal muscle tone. Coordination normal. GCS eye subscore is 4. GCS verbal subscore is 5. GCS motor subscore is 6.  Skin: Skin is warm, dry and intact.  Psychiatric: She has a normal mood and affect.    ED Course  Procedures (including critical care time) Labs Review Labs Reviewed  URINALYSIS, ROUTINE W REFLEX  MICROSCOPIC (NOT AT [facility]) - Abnormal; Notable for the following:    Leukocytes, UA TRACE (*)    All other components within normal limits  URINE MICROSCOPIC-ADD ON - Abnormal; Notable for the following:    Squamous Epithelial / LPF 0-5 (*)    Bacteria, UA RARE (*)    All other components within normal limits  PREGNANCY, URINE    Imaging Review No results found. I have personally reviewed and evaluated these images and lab results as part of my medical decision-making.   EKG Interpretation None      MDM   Final diagnoses:  Acute nonintractable headache, unspecified headache type  Bilateral low back pain without sciatica   Patient presents was 2 weeks of gradual headache. Her neurologic examination is normal. She has no ocular symptoms. Her ENT exam is normal. Patient clinically well in appearance. She has had headaches in the past intermittently. At this time she is treated for migrainous type headache with Benadryl, Reglan and Toradol. Patient also complained of lower back pain. Urinalysis is negative and pain suggest is negative. She has no associated neurologic dysfunction. No suggestion of injury. At this time I feel patient is safe to treat for generalized frontal headache and low back pain most likely musculoskeletal.    Arby BarretteMarcy Guy Seese, MD 09/03/15 1356

## 2016-03-10 ENCOUNTER — Encounter (HOSPITAL_COMMUNITY): Payer: Self-pay | Admitting: Emergency Medicine

## 2016-03-10 ENCOUNTER — Emergency Department (HOSPITAL_COMMUNITY)
Admission: EM | Admit: 2016-03-10 | Discharge: 2016-03-10 | Disposition: A | Discharge status: Home / Self Care | Payer: BLUE CROSS/BLUE SHIELD | Attending: Emergency Medicine | Admitting: Emergency Medicine

## 2016-03-10 DIAGNOSIS — J029 Acute pharyngitis, unspecified: Secondary | ICD-10-CM | POA: Diagnosis not present

## 2016-03-10 DIAGNOSIS — Z79899 Other long term (current) drug therapy: Secondary | ICD-10-CM | POA: Insufficient documentation

## 2016-03-10 HISTORY — DX: Anemia, unspecified: D64.9

## 2016-03-10 MED ORDER — ACETAMINOPHEN 325 MG PO TABS
650.0000 mg | ORAL_TABLET | Freq: Once | ORAL | Status: AC
Start: 2016-03-10 — End: 2016-03-10
  Administered 2016-03-10: 650 mg via ORAL
  Filled 2016-03-10: qty 2

## 2016-03-10 MED ORDER — LIDOCAINE VISCOUS 2 % MT SOLN: 15.0000 mL | OROMUCOSAL | 0 refills | Status: DC | PRN

## 2016-03-10 NOTE — ED Notes (Signed)
Pt refused strep screen due to throat hurting.

## 2016-03-10 NOTE — ED Triage Notes (Signed)
Pt refused throat swab

## 2016-03-10 NOTE — ED Triage Notes (Addendum)
Pt reports sore throat yesterday that developed into a cough, runny nose, and generalized body aches. Was diagnosed with a sinus infection recently. Had fever at home of 102F.

## 2016-03-10 NOTE — ED Notes (Signed)
Pt stated "I don't want the throat swab.  My throat hurts already."

## 2016-03-10 NOTE — Discharge Instructions (Signed)
This is likely a viral infection. You may use the lidocaine solution to gargle and either swallow or spit out every 3 hours as needed. Continue to take ibuprofen and Tylenol for pain. Follow-up with your primary care doctor on your appointment on Tuesday. Return to the ED if you develop worsening pain, or unable to manage your saliva, or having difficulty breathing.

## 2016-03-10 NOTE — ED Provider Notes (Signed)
[facility]-EMERGENCY DEPT Provider Note   CSN: [id number] Arrival date & time: 03/10/16  1820  By signing my name below, I, [name], attest that this documentation has been prepared under the direction and in the presence of Demetrios LollKenneth Leaphart, PA-C.  Electronically Signed: [name], ED Scribe. 03/10/16. 8:32 PM.  History   Chief Complaint Chief Complaint  Patient presents with  . Sore Throat  . Cough   The history is provided by the patient. No language interpreter was used.    HPI Comments: Colleen HoustonCharlene Fitzgerald is a 30 y.o. female with a PMHx of anemia, who presents to the Emergency Department complaining of gradual onset, constant sore throat onset yesterday. Pt notes that her pain is bilateral and not worse on one side. She report associated fever (Tmax 102), mild, cough, generalized myalgias, and rhinorrhea secondary to her sore throat. Pt has been taking Ibuprofen and Tylenol at home with minimal relief of her symptoms. Pt is able to tolerate their own secretions, but her pain is exacerbated w/ swallowing. Pt has f/u w/ her PCP in the coming week. Pt currently works as a Manufacturing systems engineerpreschool teacher and has had several of her children be out with sickness recently. Denies any difficulty breathing, or any other associated symptoms.   Past Medical History:  Diagnosis Date  . Anemia    Patient Active Problem List   Diagnosis Date Noted  . Back muscle spasm 02/08/2013  . Cold feeling 02/08/2013   History reviewed. No pertinent surgical history.  OB History    No data available     Home Medications    Prior to Admission medications   Medication Sig Start Date End Date Taking? Authorizing Provider  acetaminophen (TYLENOL) 500 MG tablet Take 1,000 mg by mouth every 6 (six) hours as needed for headache.    Historical Provider, MD  ibuprofen (ADVIL,MOTRIN) 200 MG tablet Take 400 mg by mouth every 6 (six) hours as needed for headache.    Historical Provider, MD  ibuprofen  (ADVIL,MOTRIN) 600 MG tablet Take 1 tablet (600 mg total) by mouth every 6 (six) hours as needed. 09/03/15   Arby BarretteMarcy Pfeiffer, MD  norethindrone-ethinyl estradiol (JUNEL FE,GILDESS FE,LOESTRIN FE) 1-20 MG-MCG tablet Take 1 tablet by mouth at bedtime.     Historical Provider, MD  orphenadrine (NORFLEX) 100 MG tablet Take 1 tablet (100 mg total) by mouth 2 (two) times daily. 09/03/15   Arby BarretteMarcy Pfeiffer, MD   Family History Family History  Problem Relation Age of Onset  . Hypertension Mother   . Diabetes Father    Social History Social History  Substance Use Topics  . Smoking status: Never Smoker  . Smokeless tobacco: Not on file  . Alcohol use No   Allergies   Patient has no known allergies.  Review of Systems Review of Systems  Constitutional: Positive for fever.  HENT: Positive for rhinorrhea and sore throat.   Respiratory: Negative for shortness of breath and wheezing.   Musculoskeletal: Positive for myalgias (generalized).  All other systems reviewed and are negative.  Physical Exam Updated Vital Signs BP 105/73 (BP Location: Right Arm)   Pulse 80   Temp 98.1 F (36.7 C) (Oral)   Resp 16   SpO2 99%   Physical Exam  Constitutional: She appears well-developed and well-nourished.  HENT:  Head: Normocephalic and atraumatic.  Right Ear: Tympanic membrane, external ear and ear canal normal. Tympanic membrane is not erythematous, not retracted and not bulging.  Left Ear: Tympanic membrane, external ear  and ear canal normal. Tympanic membrane is not erythematous, not retracted and not bulging.  Nose: Nose normal.  Mouth/Throat: Uvula is midline and mucous membranes are normal. No trismus in the jaw. No uvula swelling. Posterior oropharyngeal erythema present. No oropharyngeal exudate, posterior oropharyngeal edema or tonsillar abscesses. Tonsils are 0 on the right. Tonsils are 0 on the left. No tonsillar exudate.  Eyes: Conjunctivae are normal.  Neck: Normal range of motion. Neck  supple.  Cardiovascular: Normal rate.   Pulmonary/Chest: Effort normal. No respiratory distress.  Abdominal: She exhibits no distension.  Musculoskeletal: Normal range of motion.  Lymphadenopathy:    She has no cervical adenopathy.  Neurological: She is alert.  Skin: Skin is warm and dry. Capillary refill takes less than 2 seconds.  Psychiatric: She has a normal mood and affect. Her behavior is normal.  Nursing note and vitals reviewed.  ED Treatments / Results  DIAGNOSTIC STUDIES: Oxygen Saturation is 99% on RA, normal by my interpretation.   COORDINATION OF CARE: 8:32 PM-Discussed next steps with pt. Pt verbalized understanding and is agreeable with the plan.   Labs (all labs ordered are listed, but only abnormal results are displayed) Labs Reviewed  RAPID STREP SCREEN (NOT AT [facility])   Procedures Procedures   Medications Ordered in ED Medications  acetaminophen (TYLENOL) tablet 650 mg (650 mg Oral Given 03/10/16 2056)    Initial Impression / Assessment and Plan / ED Course  I have reviewed the triage vital signs and the nursing notes.  Pertinent labs & imaging results that were available during my care of the patient were reviewed by me and considered in my medical decision making (see chart for details).  Clinical Course    Pt afebrile without tonsillar exudate. Presents with no cervical lymphadenopathy, & mild dysphagia; diagnosis of viral pharyngitis. Offered Decadron injection which she declined. Pt declined strep test as well. No abx indicated. DC w symptomatic tx for pain. Pt does not appear dehydrated, but did discuss importance of water rehydration. Presentation non-concerning for PTA or infxn spread to soft tissue. No trismus or uvula deviation. Specific return precautions discussed. Pt able to drink water in ED without difficulty with intact air way. Recommended PCP follow up at her scheduled appointment. Will rx vicous lidocaine for pain management. Pt is  comfortable with above plan and is stable for discharge at this time. All questions were answered prior to disposition. Strict return precautions for f/u into the ED were discussed.   Final Clinical Impressions(s) / ED Diagnoses   Final diagnoses:  Viral pharyngitis   New Prescriptions Discharge Medication List as of 03/10/2016  8:48 PM    START taking these medications   Details  lidocaine (XYLOCAINE) 2 % solution Use as directed 15 mLs in the mouth or throat as needed for mouth pain. 15 mL gargled and may be swallowed no more often than every 3 hours as needed; MAX 8 doses in a 24-hour period, Starting Sun 03/10/2016, Print       I personally performed the services described in this documentation, which was scribed in my presence. The recorded information has been reviewed and is accurate.     Rise MuKenneth T Leaphart, PA-C 03/11/16 1430    Maia PlanJoshua G Long, MD 03/12/16 1000

## 2016-05-18 ENCOUNTER — Emergency Department (HOSPITAL_COMMUNITY): Payer: BLUE CROSS/BLUE SHIELD

## 2016-05-18 ENCOUNTER — Emergency Department (HOSPITAL_COMMUNITY)
Admission: EM | Admit: 2016-05-18 | Discharge: 2016-05-18 | Disposition: A | Discharge status: Home / Self Care | Payer: BLUE CROSS/BLUE SHIELD | Attending: Emergency Medicine | Admitting: Emergency Medicine

## 2016-05-18 DIAGNOSIS — S90112A Contusion of left great toe without damage to nail, initial encounter: Secondary | ICD-10-CM | POA: Diagnosis not present

## 2016-05-18 DIAGNOSIS — Y939 Activity, unspecified: Secondary | ICD-10-CM | POA: Insufficient documentation

## 2016-05-18 DIAGNOSIS — Z79899 Other long term (current) drug therapy: Secondary | ICD-10-CM | POA: Insufficient documentation

## 2016-05-18 DIAGNOSIS — W500XXA Accidental hit or strike by another person, initial encounter: Secondary | ICD-10-CM | POA: Insufficient documentation

## 2016-05-18 DIAGNOSIS — Y999 Unspecified external cause status: Secondary | ICD-10-CM | POA: Diagnosis not present

## 2016-05-18 DIAGNOSIS — S99922A Unspecified injury of left foot, initial encounter: Secondary | ICD-10-CM | POA: Diagnosis present

## 2016-05-18 DIAGNOSIS — Y929 Unspecified place or not applicable: Secondary | ICD-10-CM | POA: Insufficient documentation

## 2016-05-18 NOTE — ED Provider Notes (Signed)
[facility]-EMERGENCY DEPT Provider Note   CSN: [id number] Arrival date & time: 05/18/16  4098     History   Chief Complaint Chief Complaint  Patient presents with  . Toe Injury    HPI Colleen Fitzgerald is a 31 y.o. female with a PMHx of anemia and back pain/spasms, who presents to the ED with complaints of left great toe pain 1 day. Patient states that she was "playing around" with her husband around 5 PM yesterday, when he accidentally either kicked her to or stepped on it. She has had pain since then, which she describes as 7/10 constant sharp nonradiating left great toe pain worse with walking and on improved with ibuprofen, Tylenol, and ice. She endorses associated tingling and swelling in the toe, as well as some mild bruising. She denies any abrasions or lacerations, nailbed injury, focal weakness, complete numbness, loss of range of motion, or any other injuries or complaints at this time.   The history is provided by the patient and medical records. No language interpreter was used.  Toe Pain  This is a new problem. The current episode started yesterday. The problem occurs constantly. The problem has not changed since onset.The symptoms are aggravated by walking. Nothing relieves the symptoms. She has tried a cold compress and acetaminophen for the symptoms. The treatment provided no relief.    Past Medical History:  Diagnosis Date  . Anemia     Patient Active Problem List   Diagnosis Date Noted  . Back muscle spasm 02/08/2013  . Cold feeling 02/08/2013    No past surgical history on file.  OB History    No data available       Home Medications    Prior to Admission medications   Medication Sig Start Date End Date Taking? Authorizing Provider  acetaminophen (TYLENOL) 500 MG tablet Take 1,000 mg by mouth every 6 (six) hours as needed for headache.    Historical Provider, MD  ibuprofen (ADVIL,MOTRIN) 200 MG tablet Take 400 mg by mouth every 6 (six) hours as needed for  headache.    Historical Provider, MD  ibuprofen (ADVIL,MOTRIN) 600 MG tablet Take 1 tablet (600 mg total) by mouth every 6 (six) hours as needed. 09/03/15   Arby Barrette, MD  lidocaine (XYLOCAINE) 2 % solution Use as directed 15 mLs in the mouth or throat as needed for mouth pain. 15 mL gargled and may be swallowed no more often than every 3 hours as needed; MAX 8 doses in a 24-hour period 03/10/16   Rise Mu, PA-C  norethindrone-ethinyl estradiol (JUNEL FE,GILDESS FE,LOESTRIN FE) 1-20 MG-MCG tablet Take 1 tablet by mouth at bedtime.     Historical Provider, MD  orphenadrine (NORFLEX) 100 MG tablet Take 1 tablet (100 mg total) by mouth 2 (two) times daily. 09/03/15   Arby Barrette, MD    Family History Family History  Problem Relation Age of Onset  . Hypertension Mother   . Diabetes Father     Social History Social History  Substance Use Topics  . Smoking status: Never Smoker  . Smokeless tobacco: Not on file  . Alcohol use No     Allergies   Patient has no known allergies.   Review of Systems Review of Systems  Musculoskeletal: Positive for arthralgias (L great toe) and joint swelling (L great toe).  Skin: Positive for color change (bruised toe). Negative for wound.  Allergic/Immunologic: Negative for immunocompromised state.  Neurological: Negative for weakness and numbness.  Psychiatric/Behavioral: Negative for confusion.  10 Systems reviewed and are negative for acute change except as noted in the HPI.   Physical Exam Updated Vital Signs BP 126/89 (BP Location: Left Arm)   Pulse 80   Temp 98.4 F (36.9 C) (Oral)   Resp 16   LMP 04/22/2016 (Exact Date)   SpO2 100%   Physical Exam  Constitutional: She is oriented to person, place, and time. Vital signs are normal. She appears well-developed and well-nourished.  Non-toxic appearance. No distress.  Afebrile, nontoxic, NAD  HENT:  Head: Normocephalic and atraumatic.  Mouth/Throat: Mucous membranes are  normal.  Eyes: Conjunctivae and EOM are normal. Right eye exhibits no discharge. Left eye exhibits no discharge.  Neck: Normal range of motion. Neck supple.  Cardiovascular: Normal rate and intact distal pulses.   Pulmonary/Chest: Effort normal. No respiratory distress.  Abdominal: Normal appearance. She exhibits no distension.  Musculoskeletal: Normal range of motion.       Left foot: There is tenderness, bony tenderness and swelling. There is normal range of motion, normal capillary refill, no crepitus, no deformity and no laceration.  L great toe with FROM intact although painful, with mild TTP and swelling to toe but no tenderness or swelling to remainder of foot or ankle; no bruising or subungal hematoma noted, skin intact, no abrasions or lacerations. No crepitus or deformity. Strength and sensation grossly intact, distal pulses intact, compartments soft.   Neurological: She is alert and oriented to person, place, and time. She has normal strength. No sensory deficit.  Skin: Skin is warm, dry and intact. No rash noted.  Psychiatric: She has a normal mood and affect. Her behavior is normal.  Nursing note and vitals reviewed.    ED Treatments / Results  Labs (all labs ordered are listed, but only abnormal results are displayed) Labs Reviewed - No data to display  EKG  EKG Interpretation None       Radiology Dg Toe Great Left  Result Date: 05/18/2016 CLINICAL DATA:  Injury to left great toe. Pain in the left great toe for 1 day. EXAM: LEFT GREAT TOE COMPARISON:  None. FINDINGS: Soft tissue swelling is present in the left great toe. No acute or healing fracture is present. No foreign body is evident. IMPRESSION: Soft tissue swelling about the great toe without acute osseous abnormality. Electronically Signed   By: Marin Roberts M.D.   On: 05/18/2016 07:58    Procedures Procedures (including critical care time)  Medications Ordered in ED Medications - No data to  display   Initial Impression / Assessment and Plan / ED Course  I have reviewed the triage vital signs and the nursing notes.  Pertinent labs & imaging results that were available during my care of the patient were reviewed by me and considered in my medical decision making (see chart for details).  Clinical Course     31 y.o. female here with L great toe pain after her husband accidentally kicked it/stepped on it while they were playing around at home. Reports swelling, bruising, and tingling. ROM preserved although painful, mild swelling and tenderness to toe but none to foot/ankle; NVI with soft compartments; skin intact, no subungal hematoma or bruising noted to toe. Xray neg. Likely just contusion. Will buddy tape and give post op shoe for comfort; discussed RICE, tylenol/motrin for pain, buddy taping/post op shoe use, and f/up with PCP in 1wk for recheck. I explained the diagnosis and have given explicit precautions to return to the ER including for any other new  or worsening symptoms. The patient understands and accepts the medical plan as it's been dictated and I have answered their questions. Discharge instructions concerning home care and prescriptions have been given. The patient is STABLE and is discharged to home in good condition.   Final Clinical Impressions(s) / ED Diagnoses   Final diagnoses:  Contusion of left great toe without damage to nail, initial encounter    New Prescriptions New Prescriptions   No medications on file     [address]Mercedes Strupp Zemira Zehring, PA-C 05/18/16 [id number]    Rolland PorterMark James, MD 05/28/16 2045

## 2016-05-18 NOTE — ED Triage Notes (Signed)
Pt c/o left great toe pain onset yesterday after her husband accidentally kicked or stepped on it. No bleeding, pops, or cracks noted.

## 2016-05-18 NOTE — ED Notes (Signed)
Bed: [facility] Expected date:  Expected time:  Means of arrival:  Comments: 

## 2016-05-18 NOTE — Discharge Instructions (Signed)
Buddy tape your toes for comfort. Use post op shoe as needed for comfort. Ice and elevate your foot to help with pain and swelling, using an ice pack for no more than 20 minutes per hour. Alternate between tylenol and motrin as needed for pain. Follow up with your primary care doctor in 1 week for recheck of symptoms. Return to the ER for changes or worsening symptoms

## 2019-03-11 ENCOUNTER — Other Ambulatory Visit: Payer: Self-pay

## 2019-03-11 ENCOUNTER — Encounter (HOSPITAL_COMMUNITY): Payer: Self-pay | Admitting: Emergency Medicine

## 2019-03-11 ENCOUNTER — Emergency Department (HOSPITAL_COMMUNITY): Payer: BLUE CROSS/BLUE SHIELD

## 2019-03-11 ENCOUNTER — Emergency Department (HOSPITAL_COMMUNITY)
Admission: EM | Admit: 2019-03-11 | Discharge: 2019-03-12 | Disposition: A | Discharge status: Home / Self Care | Payer: BLUE CROSS/BLUE SHIELD | Attending: Emergency Medicine | Admitting: Emergency Medicine

## 2019-03-11 DIAGNOSIS — N281 Cyst of kidney, acquired: Secondary | ICD-10-CM | POA: Insufficient documentation

## 2019-03-11 DIAGNOSIS — R072 Precordial pain: Secondary | ICD-10-CM | POA: Insufficient documentation

## 2019-03-11 DIAGNOSIS — Z79899 Other long term (current) drug therapy: Secondary | ICD-10-CM | POA: Insufficient documentation

## 2019-03-11 DIAGNOSIS — R0789 Other chest pain: Secondary | ICD-10-CM | POA: Diagnosis present

## 2019-03-11 DIAGNOSIS — Z793 Long term (current) use of hormonal contraceptives: Secondary | ICD-10-CM | POA: Diagnosis not present

## 2019-03-11 LAB — CBC
HCT: 36.8 % (ref 36.0–46.0)
Hemoglobin: 11.6 g/dL — ABNORMAL LOW (ref 12.0–15.0)
MCH: 30.1 pg (ref 26.0–34.0)
MCHC: 31.5 g/dL (ref 30.0–36.0)
MCV: 95.6 fL (ref 80.0–100.0)
Platelets: 281 10*3/uL (ref 150–400)
RBC: 3.85 MIL/uL — ABNORMAL LOW (ref 3.87–5.11)
RDW: 14.4 % (ref 11.5–15.5)
WBC: 7.1 10*3/uL (ref 4.0–10.5)
nRBC: 0 % (ref 0.0–0.2)

## 2019-03-11 MED ORDER — SODIUM CHLORIDE 0.9% FLUSH
3.0000 mL | Freq: Once | INTRAVENOUS | Status: AC
  Administered 2019-03-11: 3 mL via INTRAVENOUS

## 2019-03-11 NOTE — ED Triage Notes (Signed)
Patient comes from urgent care complaining of mid chest pain that started yesterday. Patient is not complaining of any other symptoms.

## 2019-03-12 ENCOUNTER — Encounter (HOSPITAL_COMMUNITY): Payer: Self-pay

## 2019-03-12 ENCOUNTER — Emergency Department (HOSPITAL_COMMUNITY): Payer: BLUE CROSS/BLUE SHIELD

## 2019-03-12 LAB — BASIC METABOLIC PANEL
Anion gap: 7 (ref 5–15)
BUN: 11 mg/dL (ref 6–20)
CO2: 23 mmol/L (ref 22–32)
Calcium: 8.8 mg/dL — ABNORMAL LOW (ref 8.9–10.3)
Chloride: 108 mmol/L (ref 98–111)
Creatinine, Ser: 0.97 mg/dL (ref 0.44–1.00)
GFR calc Af Amer: >60 mL/min (ref >60)
GFR calc non Af Amer: >60 mL/min (ref >60)
Glucose, Bld: 101 mg/dL — ABNORMAL HIGH (ref 70–99)
Potassium: 3.8 mmol/L (ref 3.5–5.1)
Sodium: 138 mmol/L (ref 135–145)

## 2019-03-12 LAB — TROPONIN I (HIGH SENSITIVITY)
Troponin I (High Sensitivity): <2 ng/L (ref <18)
Troponin I (High Sensitivity): <2 ng/L (ref <18)

## 2019-03-12 LAB — I-STAT BETA HCG BLOOD, ED (NOT ORDERABLE): I-stat hCG, quantitative: <5 m[IU]/mL (ref <5)

## 2019-03-12 MED ORDER — KETOROLAC TROMETHAMINE 30 MG/ML IJ SOLN
15.0000 mg | Freq: Once | INTRAMUSCULAR | Status: AC
  Administered 2019-03-12: 15 mg via INTRAVENOUS
  Filled 2019-03-12 (×2): qty 1

## 2019-03-12 MED ORDER — IOHEXOL 350 MG/ML SOLN
100.0000 mL | Freq: Once | INTRAVENOUS | Status: AC | PRN
  Administered 2019-03-12: 100 mL via INTRAVENOUS

## 2019-03-12 MED ORDER — SODIUM CHLORIDE (PF) 0.9 % IJ SOLN
INTRAMUSCULAR | Status: AC
  Filled 2019-03-12: qty 50

## 2019-03-12 MED ORDER — ALUM & MAG HYDROXIDE-SIMETH 200-200-20 MG/5ML PO SUSP
30.0000 mL | Freq: Once | ORAL | Status: AC
  Administered 2019-03-12: 03:00:00 30 mL via ORAL
  Filled 2019-03-12: qty 30

## 2019-03-12 NOTE — ED Notes (Signed)
Patient transported to CT 

## 2019-03-12 NOTE — ED Provider Notes (Addendum)
[facility]-EMERGENCY DEPT Provider Note   CSN: [id number] Arrival date & time: 03/11/19  2236     History   Chief Complaint Chief Complaint  Patient presents with   Chest Pain    HPI Colleen Fitzgerald is a 33 y.o. female.     The history is provided by the patient.  Chest Pain Pain location:  Substernal area Pain radiates to:  Does not radiate Pain severity:  Moderate Onset quality:  Gradual Duration:  1 day Timing:  Constant Progression:  Waxing and waning Chronicity:  New Context: not breathing, not drug use, not eating, not intercourse, not lifting, not movement, not raising an arm, not at rest, not stress and not trauma   Relieved by:  Nothing Worsened by:  Nothing Ineffective treatments:  None tried Associated symptoms: no abdominal pain, no AICD problem, no altered mental status, no anorexia, no anxiety, no back pain, no claudication, no cough, no diaphoresis, no dizziness, no dysphagia, no fatigue, no fever, no headache, no heartburn, no lower extremity edema, no nausea, no near-syncope, no numbness, no orthopnea, no palpitations, no PND, no shortness of breath, no syncope, no vomiting and no weakness   Risk factors: not female and no prior DVT/PE     Past Medical History:  Diagnosis Date   Anemia     Patient Active Problem List   Diagnosis Date Noted   Back muscle spasm 02/08/2013   Cold feeling 02/08/2013    History reviewed. No pertinent surgical history.   OB History   No obstetric history on file.      Home Medications    Prior to Admission medications   Medication Sig Start Date End Date Taking? Authorizing Provider  acetaminophen (TYLENOL) 500 MG tablet Take 1,000 mg by mouth every 6 (six) hours as needed for headache.   Yes [provider]  buPROPion (WELLBUTRIN XL) 300 MG 24 hr tablet Take 300 mg by mouth daily. 01/20/19  Yes [provider]  diclofenac (VOLTAREN) 75 MG EC tablet Take 75 mg by mouth  daily.   Yes [provider]  ibuprofen (ADVIL,MOTRIN) 200 MG tablet Take 400 mg by mouth every 6 (six) hours as needed for headache.   Yes [provider]  JUNEL FE 1.5/30 1.5-30 MG-MCG tablet Take 1 tablet by mouth daily. 02/22/19  Yes [provider]  ibuprofen (ADVIL,MOTRIN) 600 MG tablet Take 1 tablet (600 mg total) by mouth every 6 (six) hours as needed. Patient not taking: Reported on 03/11/2019 09/03/15   Arby Barrette, MD  lidocaine (XYLOCAINE) 2 % solution Use as directed 15 mLs in the mouth or throat as needed for mouth pain. 15 mL gargled and may be swallowed no more often than every 3 hours as needed; MAX 8 doses in a 24-hour period Patient not taking: Reported on 03/11/2019 03/10/16   Rise Mu, PA-C  orphenadrine (NORFLEX) 100 MG tablet Take 1 tablet (100 mg total) by mouth 2 (two) times daily. Patient not taking: Reported on 03/11/2019 09/03/15   Arby Barrette, MD    Family History Family History  Problem Relation Age of Onset   Hypertension Mother    Diabetes Father     Social History Social History   Tobacco Use   Smoking status: Never Smoker   Smokeless tobacco: Never Used  Substance Use Topics   Alcohol use: No   Drug use: No     Allergies   Patient has no known allergies.   Review of Systems  Review of Systems  Constitutional: Negative for diaphoresis, fatigue and fever.  HENT: Negative for sore throat and trouble swallowing.   Eyes: Negative for visual disturbance.  Respiratory: Negative for cough and shortness of breath.   Cardiovascular: Positive for chest pain. Negative for palpitations, orthopnea, claudication, leg swelling, syncope, PND and near-syncope.  Gastrointestinal: Negative for abdominal pain, anorexia, heartburn, nausea and vomiting.  Genitourinary: Negative for difficulty urinating.  Musculoskeletal: Negative for back pain.  Skin: Negative for rash.  Neurological: Negative for dizziness,  weakness, numbness and headaches.  Psychiatric/Behavioral: Negative for agitation.  All other systems reviewed and are negative.    Physical Exam Updated Vital Signs BP 114/79    Pulse 84    Temp 98.5 F (36.9 C) (Oral)    Resp 17    Ht  (1.575 m)    Wt 71.2 kg    LMP 03/04/2019 Comment: negative beta HCG 03/12/19   SpO2 100%    BMI 28.72 kg/m   Physical Exam Constitutional:      General: She is not in acute distress.    Appearance: Normal appearance.  HENT:     Head: Normocephalic and atraumatic.     Nose: Nose normal.  Eyes:     Conjunctiva/sclera: Conjunctivae normal.     Pupils: Pupils are equal, round, and reactive to light.  Neck:     Musculoskeletal: Normal range of motion and neck supple.  Cardiovascular:     Rate and Rhythm: Normal rate and regular rhythm.     Pulses: Normal pulses.     Heart sounds: Normal heart sounds.  Pulmonary:     Effort: Pulmonary effort is normal.     Breath sounds: Normal breath sounds.  Abdominal:     General: Abdomen is flat. Bowel sounds are normal.     Tenderness: There is no abdominal tenderness. There is no guarding.  Musculoskeletal: Normal range of motion.  Skin:    General: Skin is warm and dry.  Neurological:     General: No focal deficit present.     Mental Status: She is alert and oriented to person, place, and time.     Deep Tendon Reflexes: Reflexes normal.  Psychiatric:        Mood and Affect: Mood normal.        Behavior: Behavior normal.      ED Treatments / Results  Labs (all labs ordered are listed, but only abnormal results are displayed) Results for orders placed or performed during the hospital encounter of 03/11/19  Basic metabolic panel  Result Value Ref Range   Sodium 138 135 - 145 mmol/L   Potassium 3.8 3.5 - 5.1 mmol/L   Chloride 108 98 - 111 mmol/L   CO2 23 22 - 32 mmol/L   Glucose, Bld 101 (H) 70 - 99 mg/dL   BUN 11 6 - 20 mg/dL   Creatinine, Ser 4.54 0.44 - 1.00 mg/dL   Calcium 8.8 (L)  8.9 - 10.3 mg/dL   GFR calc non Af Amer >60 >60 mL/min   GFR calc Af Amer >60 >60 mL/min   Anion gap 7 5 - 15  CBC  Result Value Ref Range   WBC 7.1 4.0 - 10.5 K/uL   RBC 3.85 (L) 3.87 - 5.11 MIL/uL   Hemoglobin 11.6 (L) 12.0 - 15.0 g/dL   HCT 09.8 11.9 - 14.7 %   MCV 95.6 80.0 - 100.0 fL   MCH 30.1 26.0 - 34.0 pg   MCHC 31.5 30.0 -  36.0 g/dL   RDW 16.1 09.6 - 04.5 %   Platelets 281 150 - 400 K/uL   nRBC 0.0 0.0 - 0.2 %  I-Stat beta hCG blood, ED  Result Value Ref Range   I-stat hCG, quantitative <5.0 <5 mIU/mL   Comment 3          Troponin I (High Sensitivity)  Result Value Ref Range   Troponin I (High Sensitivity) <2 <18 ng/L   Ct Abdomen Pelvis Wo Contrast  Result Date: 03/12/2019 CLINICAL DATA:  Chest pain. Abdominal pain. EXAM: CT ANGIOGRAPHY CHEST CT ABDOMEN AND PELVIS WITH CONTRAST TECHNIQUE: Multidetector CT imaging of the chest was performed using the standard protocol during bolus administration of intravenous contrast. Multiplanar CT image reconstructions and MIPs were obtained to evaluate the vascular anatomy. Multidetector CT imaging of the abdomen and pelvis was performed using the standard protocol during bolus administration of intravenous contrast. CONTRAST:  OMNIPAQUE IOHEXOL 350 MG/ML SOLN COMPARISON:  None. FINDINGS: CTA CHEST FINDINGS Cardiovascular: Contrast injection is sufficient to demonstrate satisfactory opacification of the pulmonary arteries to the segmental level. There is no pulmonary embolus. The main pulmonary artery is within normal limits for size. There is no CT evidence of acute right heart strain. The visualized aorta is normal. Heart size is normal, without pericardial effusion. Mediastinum/Nodes: --No mediastinal or hilar lymphadenopathy. --No axillary lymphadenopathy. --No supraclavicular lymphadenopathy. --Normal thyroid gland. --The esophagus is unremarkable Lungs/Pleura: There are trace bilateral pleural effusions. There is a minimal amount  of atelectasis at the lung bases. The trachea is unremarkable. There is no pneumothorax. Musculoskeletal: No chest wall abnormality. No acute or significant osseous findings. Review of the MIP images confirms the above findings. CT ABDOMEN AND PELVIS FINDINGS Hepatobiliary: The liver is normal. Normal gallbladder.There is no biliary ductal dilation. Pancreas: Normal contours without ductal dilatation. No peripancreatic fluid collection. Spleen: No splenic laceration or hematoma. Adrenals/Urinary Tract: --Adrenal glands: No adrenal hemorrhage. --Right kidney/ureter: There is a large cystic mass involving the upper pole of the right kidney. This mass measures approximately 10 by 8.6 cm. The mass demonstrates a thick peripheral wall. There is no hydronephrosis. --Left kidney/ureter: No hydronephrosis or perinephric hematoma. --Urinary bladder: Unremarkable. Stomach/Bowel: --Stomach/Duodenum: No hiatal hernia or other gastric abnormality. Normal duodenal course and caliber. --Small bowel: No dilatation or inflammation. --Colon: No focal abnormality. --Appendix: Normal. Vascular/Lymphatic: Normal course and caliber of the major abdominal vessels. --No retroperitoneal lymphadenopathy. --No mesenteric lymphadenopathy. --No pelvic or inguinal lymphadenopathy. Reproductive: Unremarkable Other: No ascites or free air. The abdominal wall is normal. Musculoskeletal. No acute displaced fractures. Review of the MIP images confirms the above findings. IMPRESSION: 1. No acute thoracic, abdominal or pelvic injury. 2. Trace bilateral pleural effusions. 3. Large cystic mass of the upper pole of the right kidney. While this is favored to represent a cyst, the wall is thicker than expected. Follow-up with a nonemergent outpatient contrast enhanced MRI is recommended for further evaluation of this finding. Electronically Signed   By: Katherine Mantle M.D.   On: 03/12/2019 02:17   Dg Chest 2 View  Result Date: 03/11/2019 CLINICAL  DATA:  Chest pain EXAM: CHEST - 2 VIEW COMPARISON:  04/16/2014 FINDINGS: The heart size and mediastinal contours are within normal limits. Both lungs are clear. The visualized skeletal structures are unremarkable. IMPRESSION: No active cardiopulmonary disease. Electronically Signed   By: Jasmine Pang M.D.   On: 03/11/2019 22:58   Ct Angio Chest Pe W And/or Wo Contrast  Result Date: 03/12/2019 CLINICAL DATA:  Chest pain. Abdominal pain. EXAM: CT ANGIOGRAPHY CHEST CT ABDOMEN AND PELVIS WITH CONTRAST TECHNIQUE: Multidetector CT imaging of the chest was performed using the standard protocol during bolus administration of intravenous contrast. Multiplanar CT image reconstructions and MIPs were obtained to evaluate the vascular anatomy. Multidetector CT imaging of the abdomen and pelvis was performed using the standard protocol during bolus administration of intravenous contrast. CONTRAST:  OMNIPAQUE IOHEXOL 350 MG/ML SOLN COMPARISON:  None. FINDINGS: CTA CHEST FINDINGS Cardiovascular: Contrast injection is sufficient to demonstrate satisfactory opacification of the pulmonary arteries to the segmental level. There is no pulmonary embolus. The main pulmonary artery is within normal limits for size. There is no CT evidence of acute right heart strain. The visualized aorta is normal. Heart size is normal, without pericardial effusion. Mediastinum/Nodes: --No mediastinal or hilar lymphadenopathy. --No axillary lymphadenopathy. --No supraclavicular lymphadenopathy. --Normal thyroid gland. --The esophagus is unremarkable Lungs/Pleura: There are trace bilateral pleural effusions. There is a minimal amount of atelectasis at the lung bases. The trachea is unremarkable. There is no pneumothorax. Musculoskeletal: No chest wall abnormality. No acute or significant osseous findings. Review of the MIP images confirms the above findings. CT ABDOMEN AND PELVIS FINDINGS Hepatobiliary: The liver is normal. Normal  gallbladder.There is no biliary ductal dilation. Pancreas: Normal contours without ductal dilatation. No peripancreatic fluid collection. Spleen: No splenic laceration or hematoma. Adrenals/Urinary Tract: --Adrenal glands: No adrenal hemorrhage. --Right kidney/ureter: There is a large cystic mass involving the upper pole of the right kidney. This mass measures approximately 10 by 8.6 cm. The mass demonstrates a thick peripheral wall. There is no hydronephrosis. --Left kidney/ureter: No hydronephrosis or perinephric hematoma. --Urinary bladder: Unremarkable. Stomach/Bowel: --Stomach/Duodenum: No hiatal hernia or other gastric abnormality. Normal duodenal course and caliber. --Small bowel: No dilatation or inflammation. --Colon: No focal abnormality. --Appendix: Normal. Vascular/Lymphatic: Normal course and caliber of the major abdominal vessels. --No retroperitoneal lymphadenopathy. --No mesenteric lymphadenopathy. --No pelvic or inguinal lymphadenopathy. Reproductive: Unremarkable Other: No ascites or free air. The abdominal wall is normal. Musculoskeletal. No acute displaced fractures. Review of the MIP images confirms the above findings. IMPRESSION: 1. No acute thoracic, abdominal or pelvic injury. 2. Trace bilateral pleural effusions. 3. Large cystic mass of the upper pole of the right kidney. While this is favored to represent a cyst, the wall is thicker than expected. Follow-up with a nonemergent outpatient contrast enhanced MRI is recommended for further evaluation of this finding. Electronically Signed   By: Katherine Mantle M.D.   On: 03/12/2019 02:17    EKG EKG Interpretation  Date/Time:  Thursday March 11 2019 22:44:07 EST Ventricular Rate:  74 PR Interval:    QRS Duration: 71 QT Interval:  401 QTC Calculation: 445 R Axis:   57 Text Interpretation: Sinus rhythm Confirmed by Deyana Wnuk ([id number]) on 03/11/2019 11:28:06 PM   Radiology Ct Abdomen Pelvis Wo Contrast  Result Date:  03/12/2019 CLINICAL DATA:  Chest pain. Abdominal pain. EXAM: CT ANGIOGRAPHY CHEST CT ABDOMEN AND PELVIS WITH CONTRAST TECHNIQUE: Multidetector CT imaging of the chest was performed using the standard protocol during bolus administration of intravenous contrast. Multiplanar CT image reconstructions and MIPs were obtained to evaluate the vascular anatomy. Multidetector CT imaging of the abdomen and pelvis was performed using the standard protocol during bolus administration of intravenous contrast. CONTRAST:  OMNIPAQUE IOHEXOL 350 MG/ML SOLN COMPARISON:  None. FINDINGS: CTA CHEST FINDINGS Cardiovascular: Contrast injection is sufficient to demonstrate satisfactory opacification of the pulmonary arteries to the segmental level. There is no pulmonary  embolus. The main pulmonary artery is within normal limits for size. There is no CT evidence of acute right heart strain. The visualized aorta is normal. Heart size is normal, without pericardial effusion. Mediastinum/Nodes: --No mediastinal or hilar lymphadenopathy. --No axillary lymphadenopathy. --No supraclavicular lymphadenopathy. --Normal thyroid gland. --The esophagus is unremarkable Lungs/Pleura: There are trace bilateral pleural effusions. There is a minimal amount of atelectasis at the lung bases. The trachea is unremarkable. There is no pneumothorax. Musculoskeletal: No chest wall abnormality. No acute or significant osseous findings. Review of the MIP images confirms the above findings. CT ABDOMEN AND PELVIS FINDINGS Hepatobiliary: The liver is normal. Normal gallbladder.There is no biliary ductal dilation. Pancreas: Normal contours without ductal dilatation. No peripancreatic fluid collection. Spleen: No splenic laceration or hematoma. Adrenals/Urinary Tract: --Adrenal glands: No adrenal hemorrhage. --Right kidney/ureter: There is a large cystic mass involving the upper pole of the right kidney. This mass measures approximately 10 by 8.6 cm. The mass  demonstrates a thick peripheral wall. There is no hydronephrosis. --Left kidney/ureter: No hydronephrosis or perinephric hematoma. --Urinary bladder: Unremarkable. Stomach/Bowel: --Stomach/Duodenum: No hiatal hernia or other gastric abnormality. Normal duodenal course and caliber. --Small bowel: No dilatation or inflammation. --Colon: No focal abnormality. --Appendix: Normal. Vascular/Lymphatic: Normal course and caliber of the major abdominal vessels. --No retroperitoneal lymphadenopathy. --No mesenteric lymphadenopathy. --No pelvic or inguinal lymphadenopathy. Reproductive: Unremarkable Other: No ascites or free air. The abdominal wall is normal. Musculoskeletal. No acute displaced fractures. Review of the MIP images confirms the above findings. IMPRESSION: 1. No acute thoracic, abdominal or pelvic injury. 2. Trace bilateral pleural effusions. 3. Large cystic mass of the upper pole of the right kidney. While this is favored to represent a cyst, the wall is thicker than expected. Follow-up with a nonemergent outpatient contrast enhanced MRI is recommended for further evaluation of this finding. Electronically Signed   By: Katherine Mantlehristopher  Green M.D.   On: 03/12/2019 02:17   Dg Chest 2 View  Result Date: 03/11/2019 CLINICAL DATA:  Chest pain EXAM: CHEST - 2 VIEW COMPARISON:  04/16/2014 FINDINGS: The heart size and mediastinal contours are within normal limits. Both lungs are clear. The visualized skeletal structures are unremarkable. IMPRESSION: No active cardiopulmonary disease. Electronically Signed   By: Jasmine PangKim  Fujinaga M.D.   On: 03/11/2019 22:58   Ct Angio Chest Pe W And/or Wo Contrast  Result Date: 03/12/2019 CLINICAL DATA:  Chest pain. Abdominal pain. EXAM: CT ANGIOGRAPHY CHEST CT ABDOMEN AND PELVIS WITH CONTRAST TECHNIQUE: Multidetector CT imaging of the chest was performed using the standard protocol during bolus administration of intravenous contrast. Multiplanar CT image reconstructions and MIPs were  obtained to evaluate the vascular anatomy. Multidetector CT imaging of the abdomen and pelvis was performed using the standard protocol during bolus administration of intravenous contrast. CONTRAST:  100mL OMNIPAQUE IOHEXOL 350 MG/ML SOLN COMPARISON:  None. FINDINGS: CTA CHEST FINDINGS Cardiovascular: Contrast injection is sufficient to demonstrate satisfactory opacification of the pulmonary arteries to the segmental level. There is no pulmonary embolus. The main pulmonary artery is within normal limits for size. There is no CT evidence of acute right heart strain. The visualized aorta is normal. Heart size is normal, without pericardial effusion. Mediastinum/Nodes: --No mediastinal or hilar lymphadenopathy. --No axillary lymphadenopathy. --No supraclavicular lymphadenopathy. --Normal thyroid gland. --The esophagus is unremarkable Lungs/Pleura: There are trace bilateral pleural effusions. There is a minimal amount of atelectasis at the lung bases. The trachea is unremarkable. There is no pneumothorax. Musculoskeletal: No chest wall abnormality. No acute or significant osseous findings.  Review of the MIP images confirms the above findings. CT ABDOMEN AND PELVIS FINDINGS Hepatobiliary: The liver is normal. Normal gallbladder.There is no biliary ductal dilation. Pancreas: Normal contours without ductal dilatation. No peripancreatic fluid collection. Spleen: No splenic laceration or hematoma. Adrenals/Urinary Tract: --Adrenal glands: No adrenal hemorrhage. --Right kidney/ureter: There is a large cystic mass involving the upper pole of the right kidney. This mass measures approximately 10 by 8.6 cm. The mass demonstrates a thick peripheral wall. There is no hydronephrosis. --Left kidney/ureter: No hydronephrosis or perinephric hematoma. --Urinary bladder: Unremarkable. Stomach/Bowel: --Stomach/Duodenum: No hiatal hernia or other gastric abnormality. Normal duodenal course and caliber. --Small bowel: No dilatation or  inflammation. --Colon: No focal abnormality. --Appendix: Normal. Vascular/Lymphatic: Normal course and caliber of the major abdominal vessels. --No retroperitoneal lymphadenopathy. --No mesenteric lymphadenopathy. --No pelvic or inguinal lymphadenopathy. Reproductive: Unremarkable Other: No ascites or free air. The abdominal wall is normal. Musculoskeletal. No acute displaced fractures. Review of the MIP images confirms the above findings. IMPRESSION: 1. No acute thoracic, abdominal or pelvic injury. 2. Trace bilateral pleural effusions. 3. Large cystic mass of the upper pole of the right kidney. While this is favored to represent a cyst, the wall is thicker than expected. Follow-up with a nonemergent outpatient contrast enhanced MRI is recommended for further evaluation of this finding. Electronically Signed   By: Katherine Mantle M.D.   On: 03/12/2019 02:17    Procedures Procedures (including critical care time)  Medications Ordered in ED Medications  sodium chloride (PF) 0.9 % injection (has no administration in time range)  sodium chloride flush (NS) 0.9 % injection 3 mL (3 mLs Intravenous Given 03/11/19 2312)  iohexol (OMNIPAQUE) 350 MG/ML injection 100 mL (100 mLs Intravenous Contrast Given 03/12/19 0133)  ketorolac (TORADOL) 30 MG/ML injection 15 mg (15 mg Intravenous Given 03/12/19 0303)  alum & mag hydroxide-simeth (MAALOX/MYLANTA) 200-200-20 MG/5ML suspension 30 mL (30 mLs Oral Given 03/12/19 0304)     Initial Impression / Assessment and Plan / ED Course  Patient is feeling better in the ED.  Ruled ou for MI and PE in the ED. HEART score is 1 very low risk for MACE. I believe this is MSK in nature and have advised symptomatic relief.  Have informed patient of renal cyst and need to follow up with PMD to have PMD to order outpatient MRI of the right kidney.  This is also printed on the patient's discharge papers.  Patient verbalizes understanding and agrees to follow up.    Colleen Fitzgerald  was evaluated in Emergency Department on 03/12/2019 for the symptoms described in the history of present illness. She was evaluated in the context of the global COVID-19 pandemic, which necessitated consideration that the patient might be at risk for infection with the SARS-CoV-2 virus that causes COVID-19. Institutional protocols and algorithms that pertain to the evaluation of patients at risk for COVID-19 are in a state of rapid change based on information released by regulatory bodies including the CDC and federal and state organizations. These policies and algorithms were followed during the patient's care in the ED.   Final Clinical Impressions(s) / ED Diagnoses   Final diagnoses:  Precordial pain   Return for weakness, numbness, changes in vision or speech, fevers >100.4 unrelieved by medication, shortness of breath, intractable vomiting, or diarrhea, abdominal pain, Inability to tolerate liquids or food, cough, altered mental status or any concerns. No signs of systemic illness or infection. The patient is nontoxic-appearing on exam and vital signs are within normal  limits.   I have reviewed the triage vital signs and the nursing notes. Pertinent labs &imaging results that were available during my care of the patient were reviewed by me and considered in my medical decision making (see chart for details).  After history, exam, and medical workup I feel the patient has been appropriately medically screened and is safe for discharge home. Pertinent diagnoses were discussed with the patient. Patient was given return precautions    Milad Bublitz, MD 03/12/19 Elrod, Sandy Haye, MD 03/12/19 [id number]

## 2019-05-19 ENCOUNTER — Other Ambulatory Visit: Payer: Self-pay | Admitting: Physician Assistant

## 2019-05-19 DIAGNOSIS — N2889 Other specified disorders of kidney and ureter: Secondary | ICD-10-CM

## 2019-05-21 ENCOUNTER — Other Ambulatory Visit: Payer: Self-pay | Admitting: Physician Assistant

## 2019-06-15 ENCOUNTER — Ambulatory Visit
Admission: RE | Admit: 2019-06-15 | Discharge: 2019-06-15 | Disposition: A | Discharge status: Home / Self Care | Payer: PRIVATE HEALTH INSURANCE | Source: Ambulatory Visit | Attending: Physician Assistant | Admitting: Physician Assistant

## 2019-06-15 DIAGNOSIS — N2889 Other specified disorders of kidney and ureter: Secondary | ICD-10-CM

## 2019-06-15 MED ORDER — GADOBENATE DIMEGLUMINE 529 MG/ML IV SOLN
14.0000 mL | Freq: Once | INTRAVENOUS | Status: AC | PRN
  Administered 2019-06-15: 14 mL via INTRAVENOUS

## 2020-01-29 IMAGING — CT CT ANGIO CHEST
4 of 11 series · 16 of 37 positions shown · IV contrast (OMNIPAQUE 350)
Comparison: None.

CLINICAL DATA: Chest pain. Abdominal pain.

EXAM:
CT ANGIOGRAPHY CHEST
CT ABDOMEN AND PELVIS WITH CONTRAST
TECHNIQUE: Multidetector CT imaging of the chest was performed using the
standard protocol during bolus administration of intravenous
contrast. Multiplanar CT image reconstructions and MIPs were
obtained to evaluate the vascular anatomy. Multidetector CT imaging
of the abdomen and pelvis was performed using the standard protocol
during bolus administration of intravenous contrast.
CONTRAST:  100mL OMNIPAQUE IOHEXOL 350 MG/ML SOLN

[Series 4: axial st · axial · 0.58mm/px · z∈[+1472,+1550]mm · 2 of 78 slices shown (1 of 2)]
[im 26/78  lung]
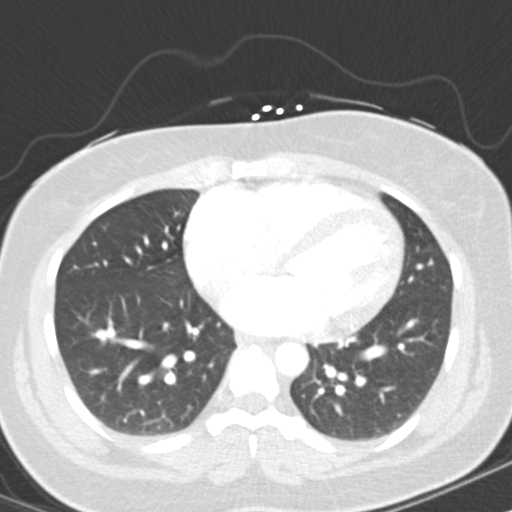
[im 52/78  lung]
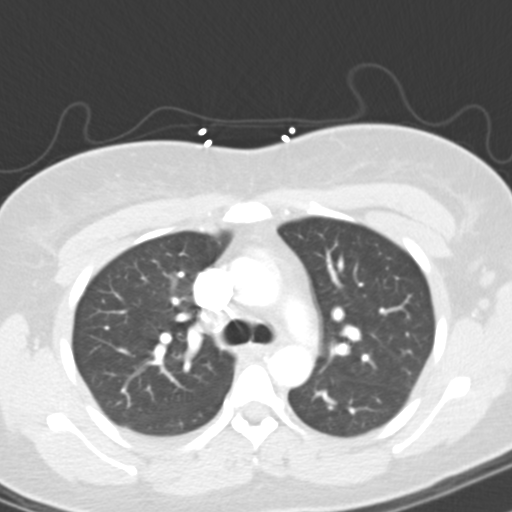

[Series 5: thins · axial · 0.58mm/px · z∈[+1416,+1606]mm · 8 of 234 slices shown]
[im 22/234  lung]
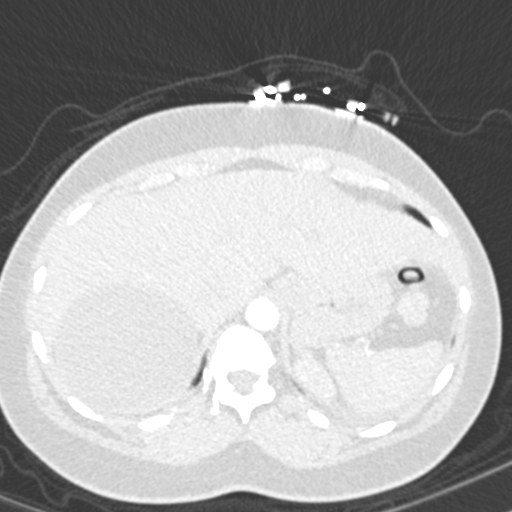
[im 43/234  lung]
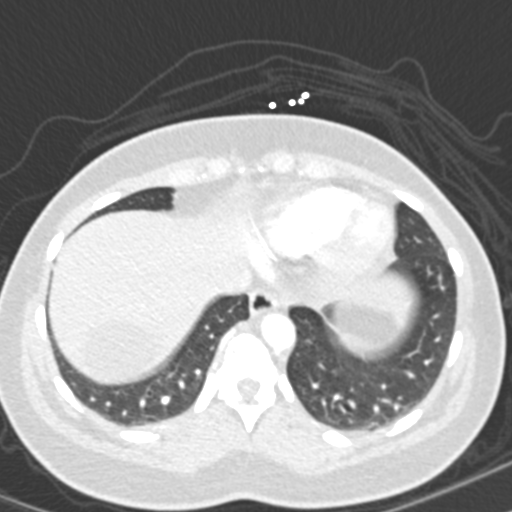
[im 85/234  lung]
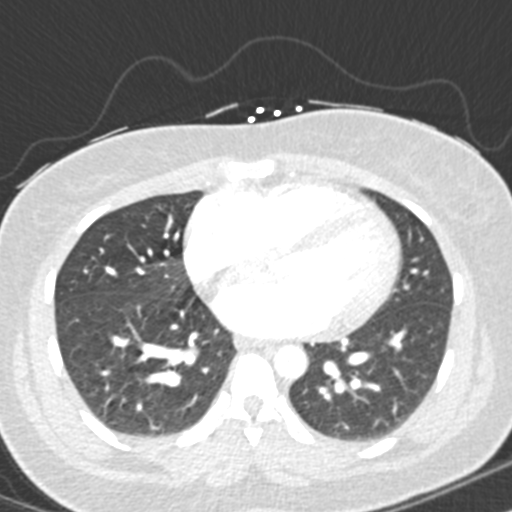
[im 106/234  lung]
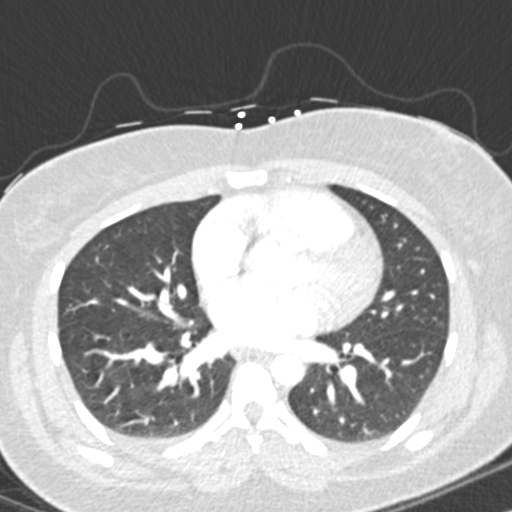
[im 128/234  lung]
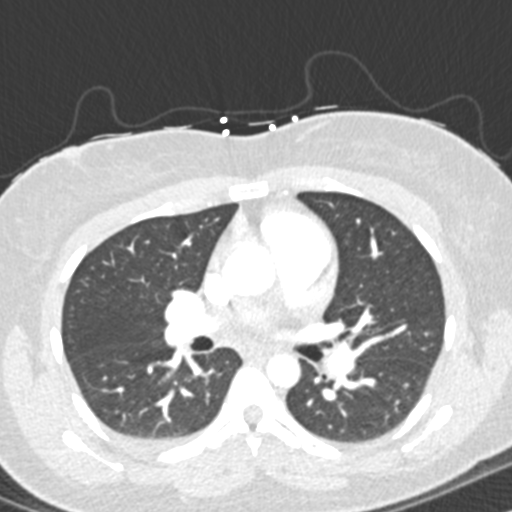
[im 149/234  lung]
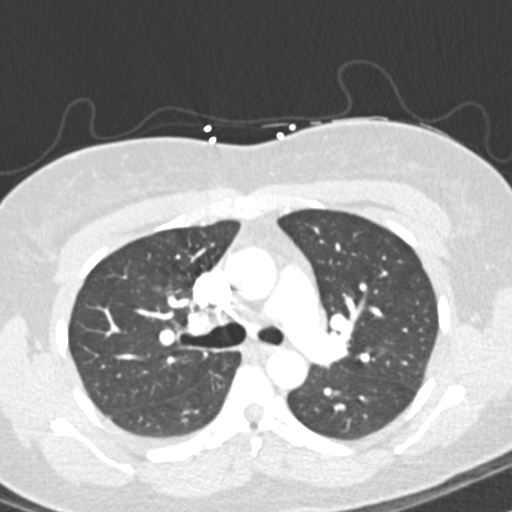
[im 191/234  lung]
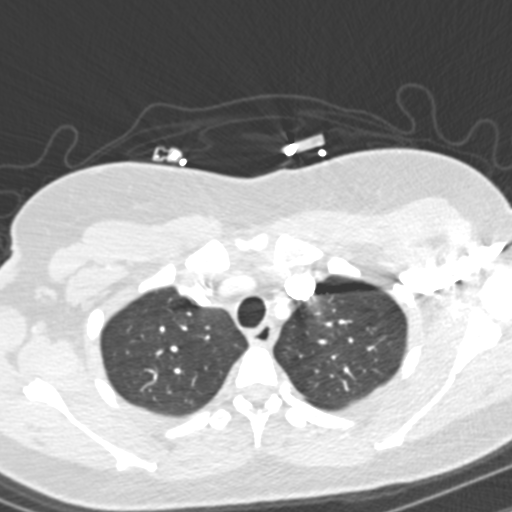
[im 212/234  lung]
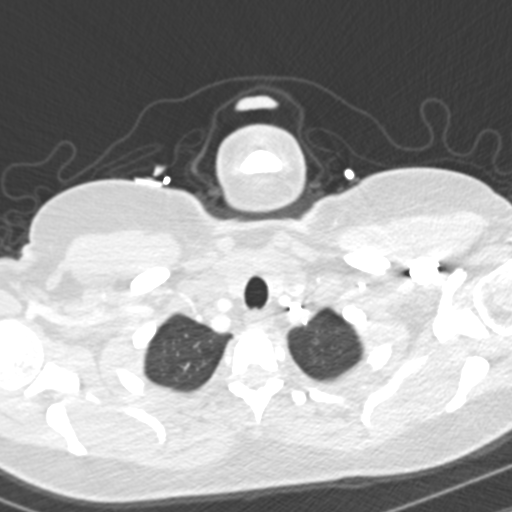

[Series 6: lung · axial · 0.58mm/px · z∈[+1456,+1584]mm · 4 of 108 slices shown]
[im 22/108  mediastinal]
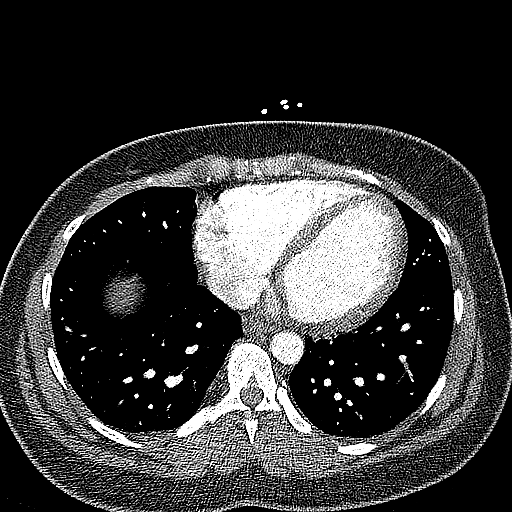
[im 43/108  mediastinal]
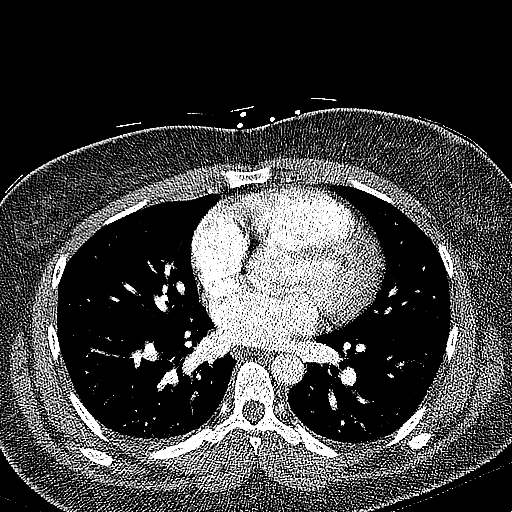
[im 65/108  mediastinal]
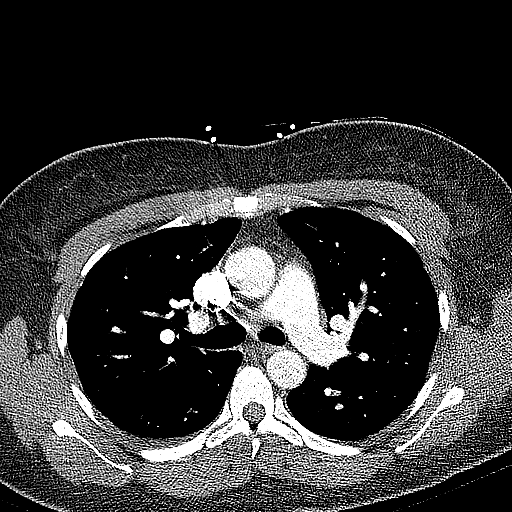
[im 86/108  mediastinal]
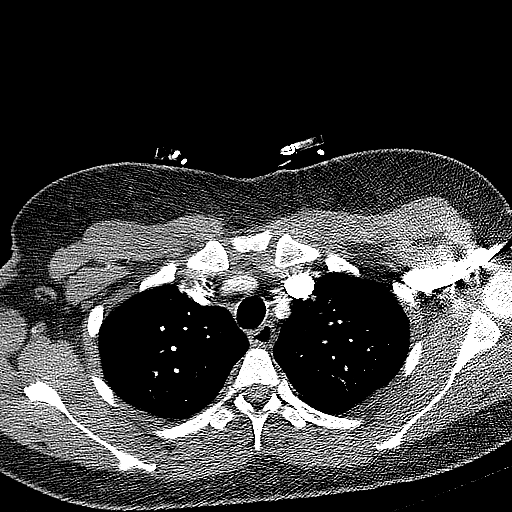

[Series 11: axial st · axial · 0.62mm/px · z∈[+1214,+1344]mm · 2 of 80 slices shown (2 of 2)]
[im 27/80  lung]
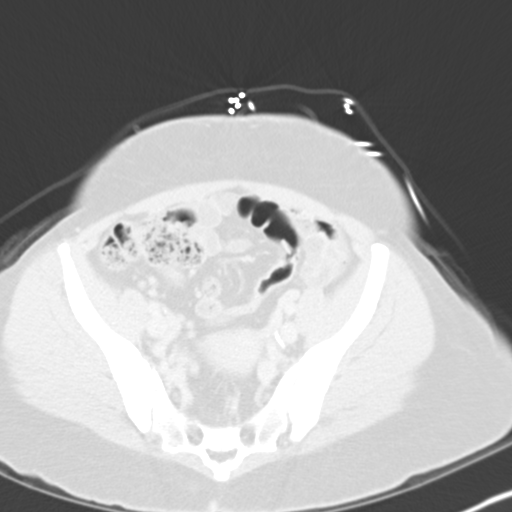
[im 53/80  mediastinal]
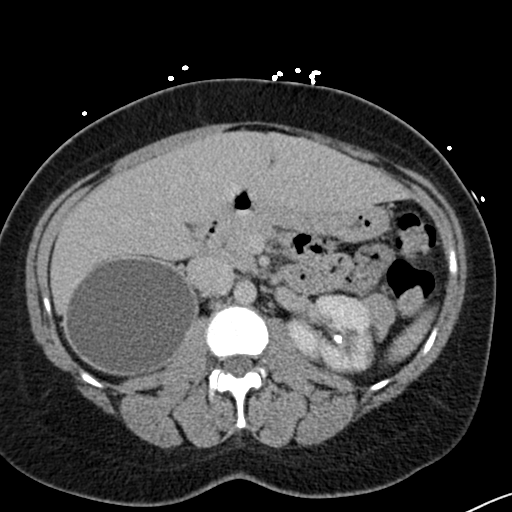

[16 of 37 positions shown; findings below may reference images not displayed]

FINDINGS: CTA CHEST FINDINGS

Cardiovascular: Contrast injection is sufficient to demonstrate
satisfactory opacification of the pulmonary arteries to the
segmental level. There is no pulmonary embolus. The main pulmonary
artery is within normal limits for size. There is no CT evidence of
acute right heart strain. The visualized aorta is normal. Heart size
is normal, without pericardial effusion.

Mediastinum/Nodes:

--No mediastinal or hilar lymphadenopathy.

--No axillary lymphadenopathy.

--No supraclavicular lymphadenopathy.

--Normal thyroid gland.

--The esophagus is unremarkable

Lungs/Pleura: There are trace bilateral pleural effusions. There is
a minimal amount of atelectasis at the lung bases. The trachea is
unremarkable. There is no pneumothorax.

Musculoskeletal: No chest wall abnormality. No acute or significant
osseous findings.

Review of the MIP images confirms the above findings.

CT ABDOMEN AND PELVIS FINDINGS

Hepatobiliary: The liver is normal. Normal gallbladder.There is no
biliary ductal dilation.

Pancreas: Normal contours without ductal dilatation. No
peripancreatic fluid collection.

Spleen: No splenic laceration or hematoma.

Adrenals/Urinary Tract:

--Adrenal glands: No adrenal hemorrhage.

--Right kidney/ureter: There is a large cystic mass involving the
upper pole of the right kidney. This mass measures approximately 10
by 8.6 cm. The mass demonstrates a thick peripheral wall. There is
no hydronephrosis.

--Left kidney/ureter: No hydronephrosis or perinephric hematoma.

--Urinary bladder: Unremarkable.

Stomach/Bowel:

--Stomach/Duodenum: No hiatal hernia or other gastric abnormality.
Normal duodenal course and caliber.

--Small bowel: No dilatation or inflammation.

--Colon: No focal abnormality.

--Appendix: Normal.

Vascular/Lymphatic: Normal course and caliber of the major abdominal
vessels.

--No retroperitoneal lymphadenopathy.

--No mesenteric lymphadenopathy.

--No pelvic or inguinal lymphadenopathy.

Reproductive: Unremarkable

Other: No ascites or free air. The abdominal wall is normal.

Musculoskeletal. No acute displaced fractures.

Review of the MIP images confirms the above findings.
IMPRESSION: 1. No acute thoracic, abdominal or pelvic injury.
2. Trace bilateral pleural effusions.
3. Large cystic mass of the upper pole of the right kidney. While
this is favored to represent a cyst, the wall is thicker than
expected. Follow-up with a nonemergent outpatient contrast enhanced
MRI is recommended for further evaluation of this finding.

## 2021-08-29 ENCOUNTER — Emergency Department
Admission: EM | Admit: 2021-08-29 | Discharge: 2021-08-29 | Disposition: A | Discharge status: Home / Self Care | Payer: BC Managed Care – PPO | Attending: Emergency Medicine | Admitting: Emergency Medicine

## 2021-08-29 ENCOUNTER — Other Ambulatory Visit: Payer: Self-pay

## 2021-08-29 ENCOUNTER — Emergency Department: Payer: BC Managed Care – PPO

## 2021-08-29 ENCOUNTER — Encounter: Payer: Self-pay | Admitting: Emergency Medicine

## 2021-08-29 DIAGNOSIS — M79642 Pain in left hand: Secondary | ICD-10-CM | POA: Diagnosis present

## 2021-08-29 DIAGNOSIS — G5602 Carpal tunnel syndrome, left upper limb: Secondary | ICD-10-CM | POA: Diagnosis not present

## 2021-08-29 MED ORDER — ETODOLAC 400 MG PO TABS: 400.0000 mg | ORAL_TABLET | Freq: Two times a day (BID) | ORAL | 0 refills | Status: AC

## 2021-08-29 NOTE — ED Provider Notes (Signed)
? ?[facility] ?Provider Note ? ? ? None  ?  (approximate) ? ? ?History  ? ?Hand Pain ? ? ?HPI ? ?Colleen Fitzgerald is a 36 y.o. female   presents to the ED with complaint of left hand and wrist pain for greater than 1 month but worse in the last several days.  Patient denies any injury.  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  She also has experienced some symptoms in her right wrist and hand but left is worse.  Patient has taken ibuprofen without any relief over the past month.  Patient denies any medical history and is a non-smoker.  She rates her pain as 6/10. ? ?  ? ? ?Physical Exam  ? ?Triage Vital Signs: ?ED Triage Vitals  ?Enc Vitals Group  ?   BP 08/29/21 0628 116/71  ?   Pulse Rate 08/29/21 0628 92  ?   Resp 08/29/21 0628 18  ?   Temp 08/29/21 0628 98.9 ?F (37.2 ?C)  ?   Temp Source 08/29/21 0628 Oral  ?   SpO2 08/29/21 0628 97 %  ?   Weight 08/29/21 0629 187 lb (84.8 kg)  ?   Height 08/29/21 0629 5\' 2"  (1.575 m)  ?   Head Circumference --   ?   Peak Flow --   ?   Pain Score 08/29/21 0628 6  ?   Pain Loc --   ?   Pain Edu? --   ?   Excl. in GC? --   ? ? ?Most recent vital signs: ?Vitals:  ? 08/29/21 0628  ?BP: 116/71  ?Pulse: 92  ?Resp: 18  ?Temp: 98.9 ?F (37.2 ?C)  ?SpO2: 97%  ? ? ? ?General: Awake, no distress.  ?CV:  Good peripheral perfusion.  Radial pulse bilaterally is present. ?Resp:  Normal effort.  ?Abd:  No distention.  ?Other:  On examination of the hands bilaterally there is no gross deformity.  On examination of the left hand there is no point tenderness and patient is able to make a complete fist.  No restriction with flexion and extension.  Tinel's sign is positive. ? ? ?ED Results / Procedures / Treatments  ? ?Labs ?(all labs ordered are listed, but only abnormal results are displayed) ?Labs Reviewed - No data to display ? ? ? ? ?RADIOLOGY ?Left hand x-ray images were reviewed by me independently of the radiologist and was negative for fracture.   Radiology reports normal left hand. ? ? ? ?PROCEDURES: ? ?Critical Care performed:  ? ?Procedures ? ? ?MEDICATIONS ORDERED IN ED: ?Medications - No data to display ? ? ?IMPRESSION / MDM / ASSESSMENT AND PLAN / ED COURSE  ?I reviewed the triage vital signs and the nursing notes. ? ? ?Differential diagnosis includes, but is not limited to, left hand pain, carpal tunnel syndrome, arthralgia, degenerative joint disease. ? ?37 year old female presents to the ED with complaint of left hand pain for greater than 1 month.  No gross deformities noted on exam however patient has positive Tinel's sign.  We discussed carpal tunnel syndrome and that she most likely will need to see an orthopedist in the future.  For now we will try an anti-inflammatory and a wrist brace for her to wear.  Patient agrees with this plan.  Dr. 31 is on-call for orthopedics and his contact information and address were given to her. ? ? ?FINAL CLINICAL IMPRESSION(S) / ED DIAGNOSES  ? ?Final diagnoses:  ?  Carpal tunnel syndrome of left wrist  ? ? ? ?Rx / DC Orders  ? ?ED Discharge Orders   ? ?      Ordered  ?  etodolac (LODINE) 400 MG tablet  2 times daily       ? 08/29/21 0735  ? ?  ?  ? ?  ? ? ? ?Note:  This document was prepared using Dragon voice recognition software and may include unintentional dictation errors. ?  ?Tommi Rumps, PA-C ?08/29/21 7169 ? ?  ?Willy Eddy, MD ?08/29/21 1255 ? ?

## 2021-08-29 NOTE — ED Triage Notes (Signed)
Patient ambulatory to triage with steady gait, without difficulty or distress noted; pt reports shooting pain to left hand/wrist for several days; denies a specific injury; denies swelling at present ?

## 2021-08-29 NOTE — Discharge Instructions (Addendum)
You may continue taking Tylenol with this medication but the etodolac should help with inflammation.  This medication is twice a day with food.  Wear the wrist splint especially at night when you are sleeping to avoid bending your wrist especially if you sleep with your hand under your pillow.  It may be necessary for you to be followed up by the orthopedist and Dr. Rosita Kea is on-call.  If you continue have problems or any worsening of your symptoms call and make an appointment to be evaluated. ?

## 2021-08-29 NOTE — ED Notes (Signed)
17 yof with a c/c of left sided wrist pain for a couple of days. The pt denies any injury. The pt also advised she has been seen for this issue before but no fracture or problem was seen on x-ray.  ?

## 2021-10-31 ENCOUNTER — Encounter (HOSPITAL_BASED_OUTPATIENT_CLINIC_OR_DEPARTMENT_OTHER): Payer: Self-pay

## 2021-10-31 ENCOUNTER — Ambulatory Visit (HOSPITAL_BASED_OUTPATIENT_CLINIC_OR_DEPARTMENT_OTHER): Admit: 2021-10-31 | Payer: PRIVATE HEALTH INSURANCE | Admitting: Obstetrics and Gynecology

## 2021-10-31 SURGERY — SALPINGECTOMY, BILATERAL, LAPAROSCOPIC
Anesthesia: Choice | Laterality: Bilateral

## 2021-12-03 ENCOUNTER — Emergency Department: Payer: BC Managed Care – PPO

## 2021-12-03 ENCOUNTER — Encounter: Payer: Self-pay | Admitting: Emergency Medicine

## 2021-12-03 ENCOUNTER — Other Ambulatory Visit: Payer: Self-pay

## 2021-12-03 ENCOUNTER — Emergency Department
Admission: EM | Admit: 2021-12-03 | Discharge: 2021-12-03 | Disposition: A | Discharge status: Home / Self Care | Payer: BC Managed Care – PPO | Attending: Emergency Medicine | Admitting: Emergency Medicine

## 2021-12-03 DIAGNOSIS — S0990XA Unspecified injury of head, initial encounter: Secondary | ICD-10-CM | POA: Insufficient documentation

## 2021-12-03 DIAGNOSIS — W19XXXA Unspecified fall, initial encounter: Secondary | ICD-10-CM

## 2021-12-03 DIAGNOSIS — M542 Cervicalgia: Secondary | ICD-10-CM | POA: Insufficient documentation

## 2021-12-03 DIAGNOSIS — W010XXA Fall on same level from slipping, tripping and stumbling without subsequent striking against object, initial encounter: Secondary | ICD-10-CM | POA: Diagnosis not present

## 2021-12-03 MED ORDER — ACETAMINOPHEN 325 MG PO TABS
650.0000 mg | ORAL_TABLET | Freq: Once | ORAL | Status: AC
Start: 2021-12-03 — End: 2021-12-03
  Administered 2021-12-03: 650 mg via ORAL
  Filled 2021-12-03: qty 2

## 2021-12-03 MED ORDER — LIDOCAINE 5 % EX PTCH
1.0000 | MEDICATED_PATCH | CUTANEOUS | Status: DC
  Administered 2021-12-03: 1 via TRANSDERMAL
  Filled 2021-12-03: qty 1

## 2021-12-03 MED ORDER — LIDOCAINE 5 % EX PTCH: 1.0000 | MEDICATED_PATCH | Freq: Two times a day (BID) | CUTANEOUS | 0 refills | Status: AC

## 2021-12-03 NOTE — ED Triage Notes (Signed)
Fell dowbn steps at home today.  Says hit head on steps.  No loc

## 2021-12-03 NOTE — ED Provider Notes (Signed)
[facility] Provider Note    Event Date/Time   First MD Initiated Contact with Patient 12/03/21 1135     (approximate)   History   Fall   HPI  Colleen Fitzgerald is a 36 y.o. female who presents today for evaluation of neck pain after a fall.  Patient reports that she had a trip and fall down 2 stairs and struck her upper back and head on the stair.  She denies loss of consciousness.  She was able to get herself up without assistance.  She denies numbness or tingling.  She has not had any vision changes or vomiting.  She does not take anticoagulation.     Physical Exam   Triage Vital Signs: ED Triage Vitals  Enc Vitals Group     BP 12/03/21 1133 121/90     Pulse Rate 12/03/21 1133 72     Resp 12/03/21 1133 16     Temp 12/03/21 1133 98.7 F (37.1 C)     Temp Source 12/03/21 1133 Oral     SpO2 12/03/21 1133 96 %     Weight 12/03/21 1121 180 lb (81.6 kg)     Height 12/03/21 1121 5\' 2"  (1.575 m)     Head Circumference --      Peak Flow --      Pain Score 12/03/21 1121 5     Pain Loc --      Pain Edu? --      Excl. in GC? --     Most recent vital signs: Vitals:   12/03/21 1133  BP: 121/90  Pulse: 72  Resp: 16  Temp: 98.7 F (37.1 C)  SpO2: 96%    Physical Exam Vitals and nursing note reviewed.  Constitutional:      General: Awake and alert. No acute distress.    Appearance: Normal appearance. The patient is normal weight.  HENT:     Head: Normocephalic and atraumatic.     Mouth: Mucous membranes are moist.  Eyes:     General: PERRL. Normal EOMs        Right eye: No discharge.        Left eye: No discharge.     Conjunctiva/sclera: Conjunctivae normal.  Cardiovascular:     Rate and Rhythm: Normal rate and regular rhythm.     Pulses: Normal pulses.     Heart sounds: Normal heart sounds Pulmonary:     Effort: Pulmonary effort is normal. No respiratory distress.     Breath sounds: Normal breath sounds.  Abdominal:     Abdomen is  soft. There is no abdominal tenderness. No rebound or guarding. No distention. Musculoskeletal:        General: No swelling. Normal range of motion.     Cervical back: Normal range of motion and neck supple.  No midline cervical spine tenderness.  Mild tenderness to palpation to right trapezius area.  No ecchymosis or erythema.  No swelling.  Full range of motion of neck.  Negative Spurling test.  Negative Lhermitte sign.  Normal strength and sensation in bilateral upper extremities. Normal grip strength bilaterally.  Normal intrinsic muscle function of the hand bilaterally.  Normal radial pulses bilaterally. Skin:    General: Skin is warm and dry.     Capillary Refill: Capillary refill takes less than 2 seconds.     Findings: No rash.  Neurological:     Mental Status: The patient is awake and alert.   Neurological: GCS 15 alert and  oriented x3 Normal speech, no expressive or receptive aphasia or dysarthria Cranial nerves II through XII intact Normal visual fields 5 out of 5 strength in all 4 extremities with intact sensation throughout No extremity drift Normal finger-to-nose testing, no limb or truncal ataxia   ED Results / Procedures / Treatments   Labs (all labs ordered are listed, but only abnormal results are displayed) Labs Reviewed - No data to display   EKG     RADIOLOGY I independently reviewed and interpreted imaging and agree with radiologists findings.     PROCEDURES:  Critical Care performed:   Procedures   MEDICATIONS ORDERED IN ED: Medications  lidocaine (LIDODERM) 5 % 1 patch (1 patch Transdermal Patch Applied 12/03/21 1240)  acetaminophen (TYLENOL) tablet 650 mg (650 mg Oral Given 12/03/21 1239)     IMPRESSION / MDM / ASSESSMENT AND PLAN / ED COURSE  I reviewed the triage vital signs and the nursing notes.   Differential diagnosis includes, but is not limited to, contusion, strain, sprain, fracture, concussion.  Patient is awake and alert,  hemodynamically stable and afebrile.  She is neurologically intact.  No loss of consciousness, no vomiting, not on anticoagulation, no focal neurological deficits.  She has a headache. She agreed to undergo CT head and neck which were normal. She has normal strength and sensation of bilateral upper extremities, normal grip strength do not suspect central cord syndrome. We discussed strict return precautions and the importance of close outpatient follow up.  Patient understands and agrees with plan.  Discharged in stable condition.   Patient's presentation is most consistent with acute complicated illness / injury requiring diagnostic workup.    Clinical Course as of 12/03/21 1852  Mon Dec 03, 2021  1316 On reevaluation, patient continues to have neck pain.  She agreed to undergo CT scan of her cervical spine [JP]    Clinical Course User Index [JP] Bryson Palen, Herb Grays, PA-C     FINAL CLINICAL IMPRESSION(S) / ED DIAGNOSES   Final diagnoses:  Fall, initial encounter  Injury of head, initial encounter     Rx / DC Orders   ED Discharge Orders          Ordered    lidocaine (LIDODERM) 5 %  Every 12 hours        12/03/21 1434             Note:  This document was prepared using Dragon voice recognition software and may include unintentional dictation errors.   [name] 12/03/21 1852    Minna Antis, MD 12/04/21 1919

## 2021-12-03 NOTE — Discharge Instructions (Addendum)
Your CT scans were normal.  Please return for any new, worsening, or change in symptoms or other concerns including worsening headache, worsening neck pain, numbness, tingling, weakness in your extremities, vomiting, vision changes, or any other concerns.

## 2022-07-18 IMAGING — CR DG HAND COMPLETE 3+V*L*
3 series · 3 of 3 positions shown · non-contrast
Comparison: None.

CLINICAL DATA: Shooting pain to left hand and wrist herself days.

EXAM:
LEFT HAND - COMPLETE 3+ VIEW

[hand ap]
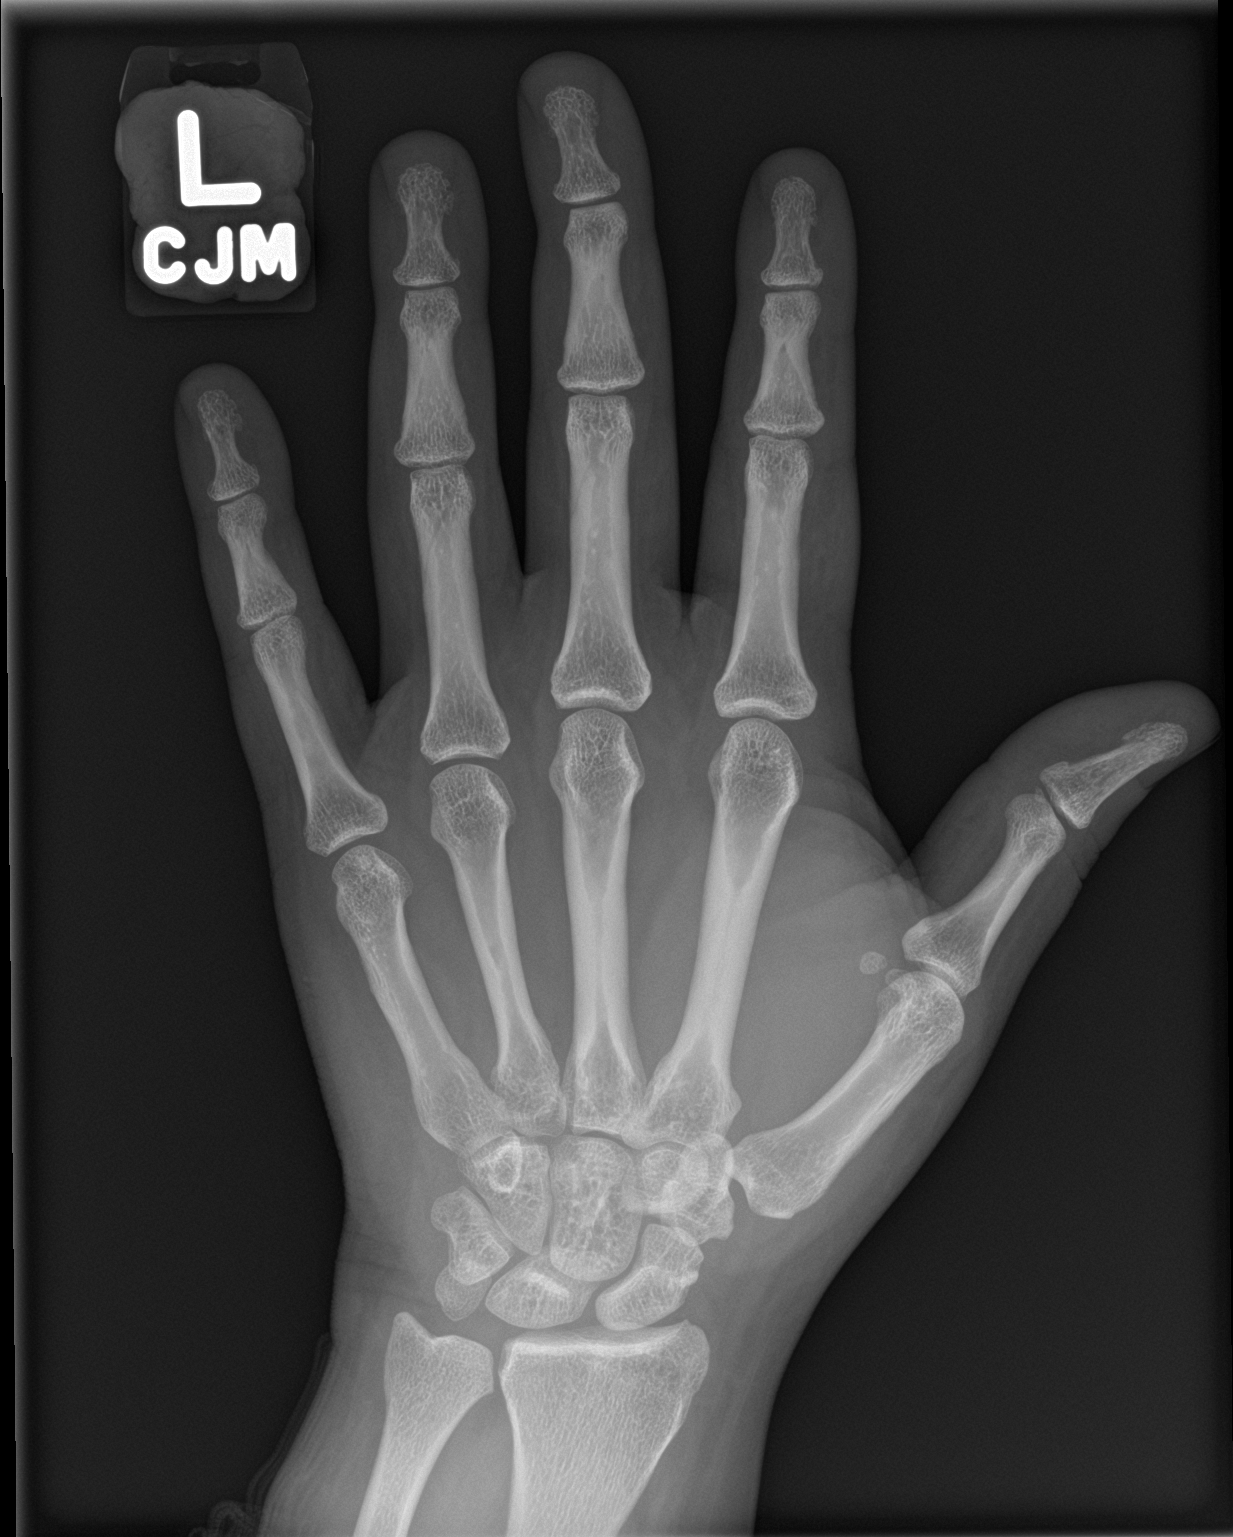

[hand obl]
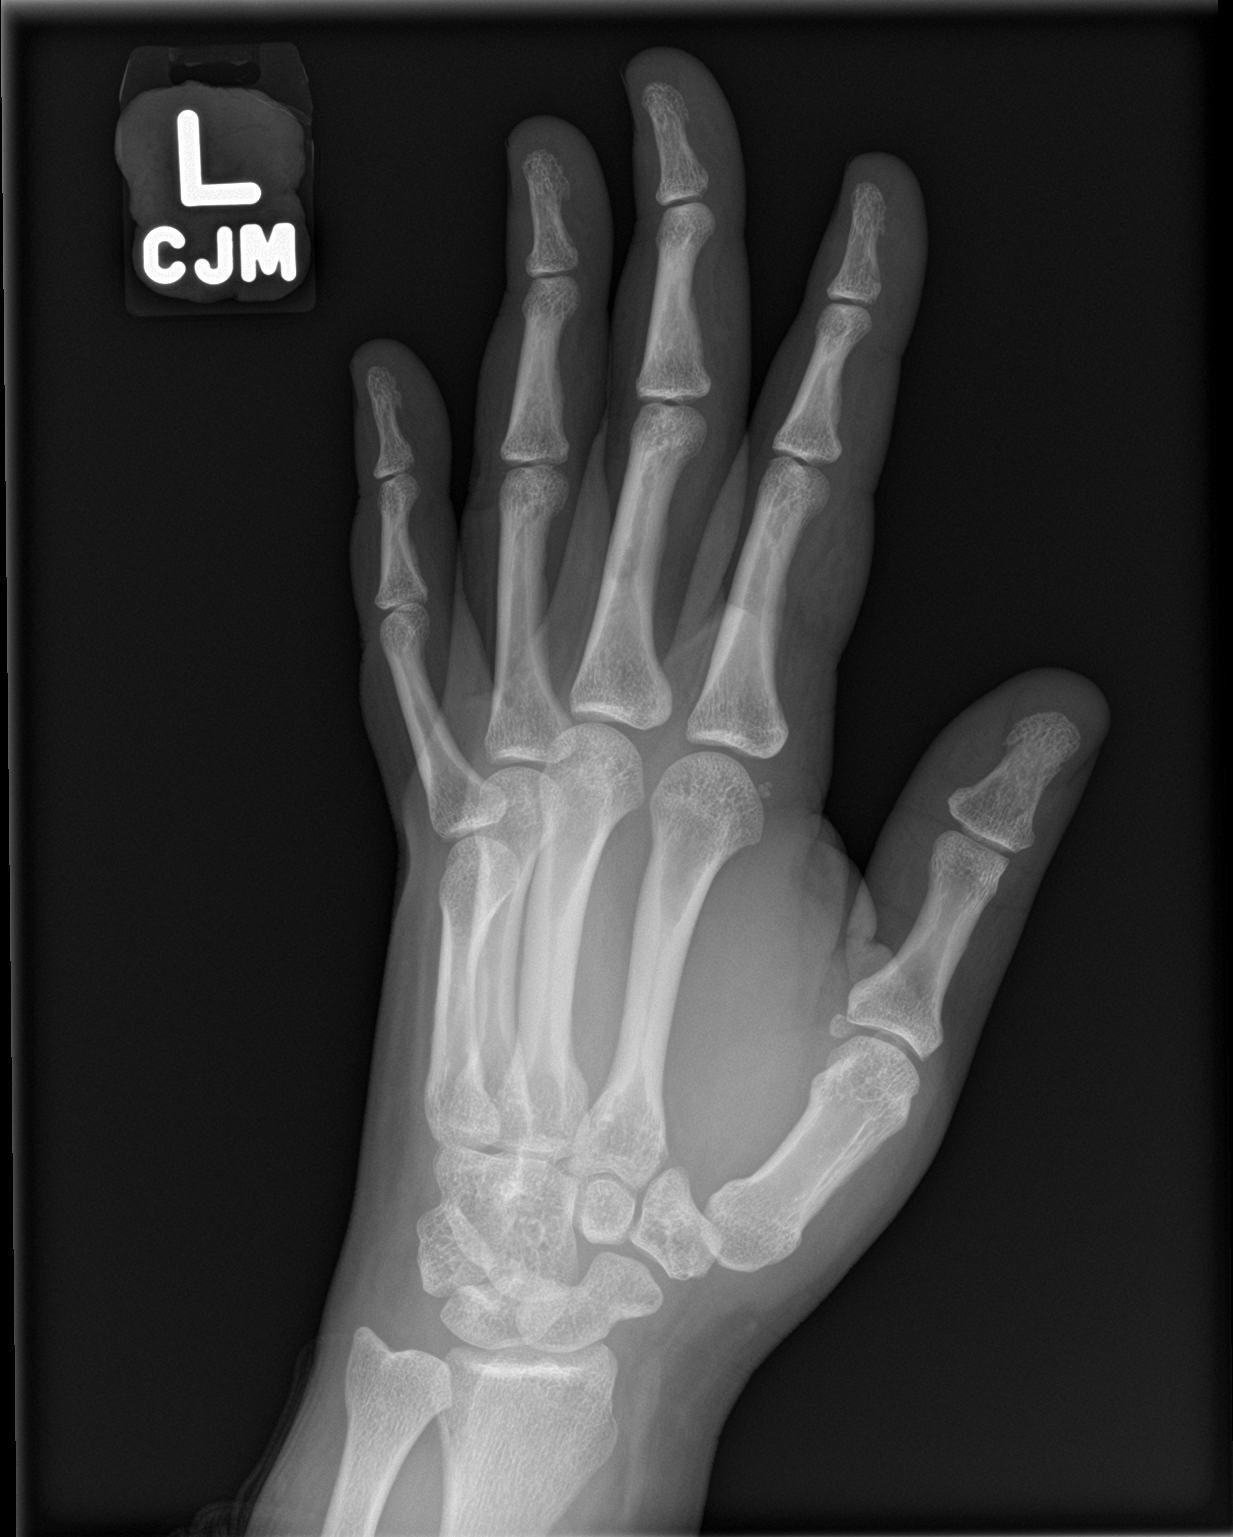

[hand lat]
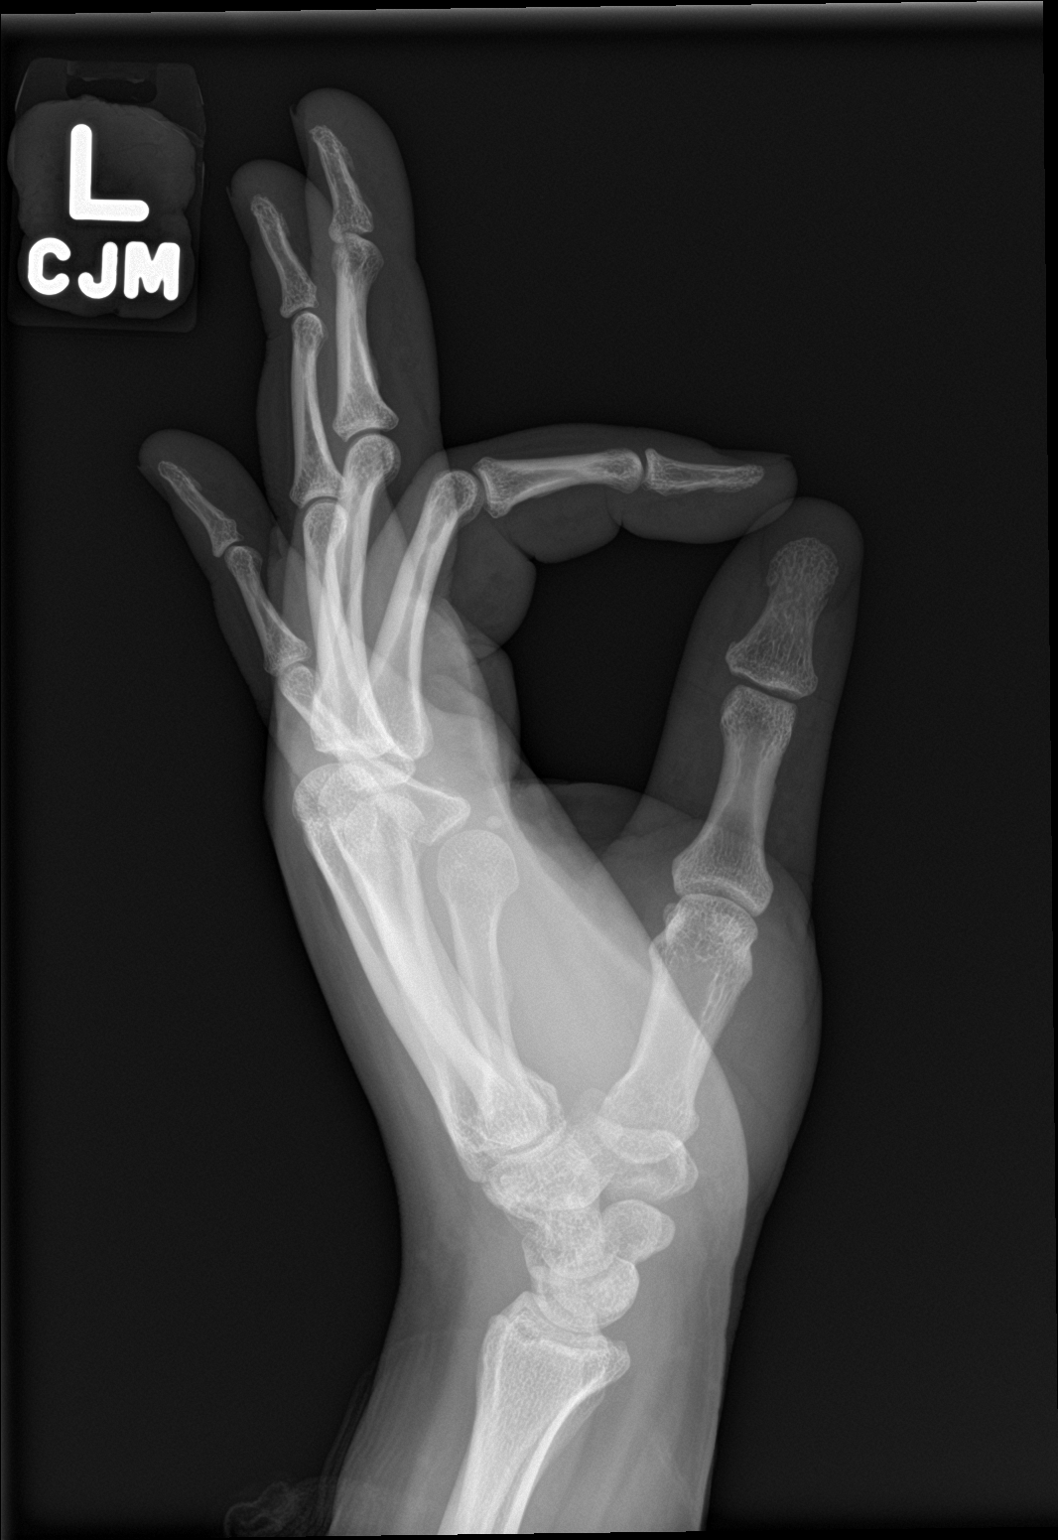

[3 of 3 positions shown; findings below may reference images not displayed]

FINDINGS: Normal bone mineralization. Joint spaces are preserved. No acute
fracture is seen. No dislocation.
IMPRESSION: Normal left hand radiographs.

## 2022-09-22 ENCOUNTER — Other Ambulatory Visit: Payer: Self-pay

## 2022-09-22 ENCOUNTER — Encounter: Payer: Self-pay | Admitting: Emergency Medicine

## 2022-09-22 ENCOUNTER — Emergency Department
Admission: EM | Admit: 2022-09-22 | Discharge: 2022-09-22 | Disposition: A | Discharge status: Home / Self Care | Payer: BC Managed Care – PPO | Attending: Emergency Medicine | Admitting: Emergency Medicine

## 2022-09-22 DIAGNOSIS — J029 Acute pharyngitis, unspecified: Secondary | ICD-10-CM | POA: Diagnosis present

## 2022-09-22 DIAGNOSIS — M791 Myalgia, unspecified site: Secondary | ICD-10-CM | POA: Insufficient documentation

## 2022-09-22 DIAGNOSIS — R509 Fever, unspecified: Secondary | ICD-10-CM | POA: Diagnosis not present

## 2022-09-22 MED ORDER — AMOXICILLIN 875 MG PO TABS: 875.0000 mg | ORAL_TABLET | Freq: Two times a day (BID) | ORAL | 0 refills | Status: AC

## 2022-09-22 NOTE — ED Notes (Signed)
Per EDP, Pt has refused all testing.

## 2022-09-22 NOTE — ED Notes (Signed)
Pt verbalized understanding of discharge instructions. Opportunity for questions provided.  

## 2022-09-22 NOTE — Discharge Instructions (Signed)
Follow-up with your regular doctor if not improving to 3 days.  Gargle with warm salt water.  Take Tylenol or ibuprofen as needed.

## 2022-09-22 NOTE — ED Triage Notes (Signed)
Pt reports sore throat, fever, and general bodyaches starting Thursday, getting worse Friday. Reports no real nasal drainage and denies recent exposures.

## 2022-09-22 NOTE — ED Provider Notes (Signed)
[facility] Provider Note    Event Date/Time   First MD Initiated Contact with Patient 09/22/22 [phone]     (approximate)   History   Sore Throat, Fever, and Generalized Body Aches   HPI  Colleen Fitzgerald is a 37 y.o. female with no significant past medical history presents emergency department complaint of sore throat, fever and bodyaches since Thursday.  Started getting worse on Friday.  They do not exposure to COVID or strep.  Vomiting or diarrhea.      Physical Exam   Triage Vital Signs: ED Triage Vitals  Enc Vitals Group     BP 09/22/22 0759 120/82     Pulse Rate 09/22/22 0759 99     Resp 09/22/22 0759 16     Temp 09/22/22 0759 98.8 F (37.1 C)     Temp Source 09/22/22 0759 Oral     SpO2 09/22/22 0759 100 %     Weight 09/22/22 0743 180 lb 12.4 oz (82 kg)     Height 09/22/22 0743 5\' 2"  (1.575 m)     Head Circumference --      Peak Flow --      Pain Score 09/22/22 0743 10     Pain Loc --      Pain Edu? --      Excl. in GC? --     Most recent vital signs: Vitals:   09/22/22 0759  BP: 120/82  Pulse: 99  Resp: 16  Temp: 98.8 F (37.1 C)  SpO2: 100%     General: Awake, no distress.   CV:  Good peripheral perfusion. regular rate and  rhythm Resp:  Normal effort. Lungs cta Abd:  No distention.   Other:  Throat is bright red, some swelling noted, no exudate, neck is supple, no lymphadenopathy   ED Results / Procedures / Treatments   Labs (all labs ordered are listed, but only abnormal results are displayed) Labs Reviewed - No data to display    EKG     RADIOLOGY    PROCEDURES:   Procedures   MEDICATIONS ORDERED IN ED: Medications - No data to display   IMPRESSION / MDM / ASSESSMENT AND PLAN / ED COURSE  I reviewed the triage vital signs and the nursing notes.                              Differential diagnosis includes, but is not limited to, strep throat, pharyngitis, tonsillitis, abscess  Patient's  presentation is most consistent with acute complicated illness / injury requiring diagnostic workup.   Patient is refusing all testing including strep and COVID  Patient states she just wants to be treated and wants to go home.  Gave her a prescription for amoxicillin as the throat is red and swollen could indicate strep throat.  I explained to her she breaks out in a rash may be having an allergy or it may be viral.  Told her this is not proper treatment without getting a strep test.  Patient states she does not care.  She was discharged stable condition.      FINAL CLINICAL IMPRESSION(S) / ED DIAGNOSES   Final diagnoses:  Acute pharyngitis, unspecified etiology     Rx / DC Orders   ED Discharge Orders          Ordered    amoxicillin (AMOXIL) 875 MG tablet  2 times daily  09/22/22 0808             Note:  This document was prepared using Dragon voice recognition software and may include unintentional dictation errors.    Faythe Ghee, PA-C 09/22/22 4098    Phineas Semen, MD 09/22/22 [phone]

## 2024-06-04 ENCOUNTER — Encounter: Payer: Self-pay | Admitting: Gastroenterology
